# Patient Record
Sex: Male | Born: 1950
Health system: Southern US, Community
[De-identification: ages and names within clinical notes are randomized; demographics above are authoritative.]

## PROBLEM LIST (undated history)

## (undated) DIAGNOSIS — K635 Polyp of colon: Secondary | ICD-10-CM

## (undated) DIAGNOSIS — K219 Gastro-esophageal reflux disease without esophagitis: Secondary | ICD-10-CM

## (undated) DIAGNOSIS — J189 Pneumonia, unspecified organism: Secondary | ICD-10-CM

## (undated) DIAGNOSIS — K5792 Diverticulitis of intestine, part unspecified, without perforation or abscess without bleeding: Secondary | ICD-10-CM

## (undated) DIAGNOSIS — J939 Pneumothorax, unspecified: Secondary | ICD-10-CM

## (undated) DIAGNOSIS — M1990 Unspecified osteoarthritis, unspecified site: Secondary | ICD-10-CM

## (undated) DIAGNOSIS — L409 Psoriasis, unspecified: Secondary | ICD-10-CM

---

## 1991-08-11 HISTORY — PX: OTHER SURGICAL HISTORY: SHX169

## 1991-08-11 HISTORY — PX: HERNIA REPAIR: SHX51

## 2003-10-06 ENCOUNTER — Emergency Department (HOSPITAL_COMMUNITY): Admission: EM | Admit: 2003-10-06 | Discharge: 2003-10-07 | Payer: Self-pay | Admitting: *Deleted

## 2004-03-25 ENCOUNTER — Other Ambulatory Visit: Admission: RE | Admit: 2004-03-25 | Discharge: 2004-03-25 | Payer: Self-pay | Admitting: Dermatology

## 2005-05-14 ENCOUNTER — Emergency Department (HOSPITAL_COMMUNITY): Admission: EM | Admit: 2005-05-14 | Discharge: 2005-05-15 | Payer: Self-pay | Admitting: Emergency Medicine

## 2006-03-05 ENCOUNTER — Encounter (INDEPENDENT_AMBULATORY_CARE_PROVIDER_SITE_OTHER): Payer: Self-pay | Admitting: Specialist

## 2006-03-05 ENCOUNTER — Ambulatory Visit: Payer: Self-pay | Admitting: Internal Medicine

## 2006-03-05 ENCOUNTER — Ambulatory Visit (HOSPITAL_COMMUNITY): Admission: RE | Admit: 2006-03-05 | Discharge: 2006-03-05 | Payer: Self-pay | Admitting: Internal Medicine

## 2007-08-15 ENCOUNTER — Inpatient Hospital Stay (HOSPITAL_COMMUNITY): Admission: EM | Admit: 2007-08-15 | Discharge: 2007-08-19 | Payer: Self-pay | Admitting: Emergency Medicine

## 2010-04-21 ENCOUNTER — Emergency Department (HOSPITAL_COMMUNITY): Admission: EM | Admit: 2010-04-21 | Discharge: 2010-04-22 | Payer: Self-pay | Admitting: Emergency Medicine

## 2010-12-23 NOTE — Discharge Summary (Signed)
NAME:  Jeffrey Mcmahon, Jeffrey Mcmahon                  ACCOUNT NO.:  [id number]   MEDICAL RECORD NO.:  [id number]          PATIENT TYPE:  INP   LOCATION:  A308                          FACILITY:  [facility]   PHYSICIAN:  Tilford Pillar, MD      DATE OF BIRTH:  1950/12/05   DATE OF ADMISSION:  08/15/2007  DATE OF DISCHARGE:  01/09/2009LH                               DISCHARGE SUMMARY   ADMISSION DIAGNOSIS:  Right spontaneous pneumothorax.   DISCHARGE DIAGNOSES:  1. Status post right spontaneous pneumothorax with chest tube      placement.  2. Asthma, chronic obstructive pulmonary disease.   PROCEDURES:  Right sided Pleurocath placement.   SURGICAL CONSULTATION:  Tilford Pillar, M.D.   DISPOSITION:  Home.   BRIEF HISTORY AND PHYSICAL:  Please see the admission history and  physical for a complete H&P.  The patient is a 60 year old gentleman who  presented to [facility] with acute onset of shortness of breath  and chest pain on the right side.  During his evaluation in the  emergency department he was discovered to have a spontaneous  pneumothorax.  The patient was admitted for continued management and  monitoring of the pneumothorax.   HOSPITAL COURSE:  The patient was admitted on August 15, 2007.  He  underwent the placement of a new catheter in the emergency department  before being transferred to the floor for continued monitoring.  On the  subsequent hospital days he continued to be monitored with serial chest  x-rays which demonstrated persistence of the pneumothorax, increasing of  the negative pressure on the catheter did result in resolution.  On the  morning of August 19, 2007, the patient had his Pleurocath set to water  seal.  A followup x-ray demonstrated complete resolution of the  pneumothorax.  Therefore, the catheter was removed and an occlusive  dressing was placed at the time of removal.  The patient was discharged  to home and was in no acute distress.   DISCHARGE  INSTRUCTIONS:  The patient is to resume normal activities.  He  may return to work as tolerated.  He was instructed to keep the  occlusive dressing on for a 24-hour period, then he may remove it in the  shower.  He was instructed to resume all previously prescribed home  medications as well as to follow up with Dr. Ouida Sills as  previously arranged.  No immediate followup with myself or Dr. Ouida Sills is  necessary at this time.  The patient was instructed that should he  develop shortness of breath that he should return to the emergency  department.   DISCHARGE MEDICATIONS:  Albuterol.      Tilford Pillar, MD  Electronically Signed     BZ/MEDQ  D:  08/19/2007  T:  08/19/2007  Job:  [id number]   cc:   Kingsley Callander. Ouida Sills, MD  Fax: [phone]

## 2010-12-23 NOTE — H&P (Signed)
NAME:  Jeffrey Mcmahon, BARRETTO                  ACCOUNT NO.:  [id number]   MEDICAL RECORD NO.:  [id number]          PATIENT TYPE:  INP   LOCATION:  A302                          FACILITY:  [facility]   PHYSICIAN:  Kingsley Callander. Ouida Sills, MD       DATE OF BIRTH:  07-May-1951   DATE OF ADMISSION:  08/15/2007  DATE OF DISCHARGE:  LH                              HISTORY & PHYSICAL   CHIEF COMPLAINT:  Right chest pain.   HISTORY OF PRESENT ILLNESS:  This patient is a 60 year old white male  who presented to the emergency room after developing pain in the right  side of his chest pain.  The pain was increased with taking a deep  breath or coughing.  He had not experienced any fever or sputum  production.  He was found to have a 20% pneumothorax on the right.  He  has a past history of asthma.   PAST MEDICAL HISTORY:  1. Asthma.  2. Allergies.  3. Eosinophilia  4. Psoriasis.  5. Tubular adenomas.  6. History of septic right hip.  7. Status post left inguinal hernia repair.   MEDICATIONS:  1. Claritin 10 mg daily.  2. Albuterol p.r.n.  3. Advair 100 1 puff b.i.d.   ALLERGIES:  NONE.   FAMILY HISTORY:  His father had COPD, TB, and diabetes.  His mother had  COPD.   SOCIAL HISTORY:  He is a Optician, dispensing.  He does not smoke or drink.   REVIEW OF SYSTEMS:  Noncontributory.   PHYSICAL EXAMINATION:  VITAL SIGNS:  Temperature 98.3, pulse 81, blood  pressure 121/78, respirations 18, oxygen saturation 99% on room air.  GENERAL:  Alert and in no acute distress.  HEENT:  Eyes, nose, and pharynx normal.  NECK:  No JVD or thyromegaly.  LUNGS:  Clear.  HEART:  Regular with no murmurs.  ABDOMEN:  Nontender.  No hepatosplenomegaly.  EXTREMITIES:  No calf tenderness or swelling.  No cyanosis, clubbing, or  edema.  NEUROLOGIC:  Intact.  LYMPH NODES:  No cervical or supraclavicular enlargement.  SKIN:  Several psoriasis-type lesions are present.   LABORATORY DATA:  White count 4.6, hemoglobin 14.8.  Sodium 139,  potassium 3.6, glucose 98, creatinine 0.87.  SGOT 24, calcium 9.2.   Chest x-ray reveals a 20% right pneumothorax.   IMPRESSION:  1. Spontaneous right pneumothorax.  Dr. Leticia Penna from general surgery      has been consulted and has placed a chest tube on the right.  Mr.      Mcmahon had a brief episode of syncope when the chest tube was placed.      He developed a bradycardia and actually a code was briefly called.      Placed in Trendelenburg, and his heart rhythm returned  to normal      and he became alert and fully awake.  2. Asthma/allergies.  Stable.  3. Psoriasis.      Kingsley Callander. Ouida Sills, MD  Electronically Signed     ROF/MEDQ  D:  08/16/2007  T:  08/16/2007  Job:  [id number]

## 2010-12-23 NOTE — Consult Note (Signed)
NAME:  Jeffrey Mcmahon, Jeffrey Mcmahon                  ACCOUNT NO.:  [id number]   MEDICAL RECORD NO.:  [id number]          PATIENT TYPE:  INP   LOCATION:  A302                          FACILITY:  [facility]   PHYSICIAN:  Tilford Pillar, MD      DATE OF BIRTH:  04-Sep-1950   DATE OF CONSULTATION:  DATE OF DISCHARGE:                                 CONSULTATION   REASON FOR CONSULTATION:  Spontaneous pneumothorax.   HISTORY OF PRESENT ILLNESS:  The patient is a 60 year old male who  presented to the [facility] emergency department after acute onset of  right chest pain.  He described this as the acute onset of sharp pain  with inspiration this morning.  He continued his usual morning  activities with no resolution of the pain.  This has been constant,  worse with inspirations.  He has had no nausea and vomiting, no fever or  chills, no lightheadedness.  He has had no prior symptoms similar to  this in the past.   PAST MEDICAL HISTORY:  Positive for COPD.   PAST SURGICAL HISTORY:  No thoracic surgery noted in the past.   MEDICATIONS:  Briefly are reviewed with the patient.  He is on an  albuterol inhaler taken on an as needed basis.  He requires this  approximately one time per day.   PHYSICAL EXAMINATION:  The patient is currently sitting in an upright  position on an emergency room cart in no acute distress.  He is alert  and oriented.  Pupils are equal, round, and reactive.  Extraocular  movements are intact.  Oral mucosa is pink and moist.  Trachea is  midline.  There is no JVD noted.  There is no cervical lymphadenopathy  appreciated.  No subcutaneous emphysema is apparent on palpation.  He  has slightly diminished breath sounds on the right compared to the left.  No crackles or wheezes are appreciated.  Heart rate is regular rate and  rhythm.  Extremities are warm and dry.   PERTINENT LABORATORY AND RADIOGRAPHIC STUDIES:  An upright chest x-ray  demonstrates an approximate 20% pneumothorax on the  patient's right  side.  No other abnormalities are noted.   ASSESSMENT AND PLAN:  Spontaneous pneumothorax on the right side.  This  is the patient's first episode of a spontaenous pneumothorax.  He is  advised to have a tube decompression of this as this is a small  pneumothorax.  This was recommended to first place a pneumo cath.  The  risks, benefits, and alternatives of placement of a pneumo cath  including the placement of a standard thoracotomy tube were discussed at  length with the patient.  At this point,  plans will be made for placement of the pneumo cath using sterile  technique.  Upon placement of the catheter, he will be admitted by Dr.  Ouida Sills, and I will continue to follow the patient for continued  management of this pneumothorax.      Tilford Pillar, MD  Electronically Signed     BZ/MEDQ  D:  08/15/2007  T:  08/16/2007  Job:  [id number]   cc:   Kingsley Callander. Ouida Sills, MD  Fax: [phone]

## 2010-12-23 NOTE — Discharge Summary (Signed)
NAME:  Jeffrey Mcmahon, Jeffrey Mcmahon                  ACCOUNT NO.:  [id number]   MEDICAL RECORD NO.:  [id number]          PATIENT TYPE:  INP   LOCATION:  A308                          FACILITY:  [facility]   PHYSICIAN:  Kingsley Callander. Ouida Sills, MD       DATE OF BIRTH:  09/27/1950   DATE OF ADMISSION:  08/15/2007  DATE OF DISCHARGE:  01/09/2009LH                               DISCHARGE SUMMARY   DISCHARGE DIAGNOSES:  1. Spontaneous right pneumothorax.  2. Asthma.   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  He was evaluated in the emergency  room and found to have a 20% pneumothorax.  Dr. Leticia Penna was consulted.  A chest tube was placed.  There was gradual improvement.  The chest tube  was placed to water seal on August 19, 2007.  He had complete resolution  of the pneumothorax at that point.  The chest tube was then pulled  without complication.  The patient is now asymptomatic.   He has a history of asthma which has been stable.  He will continue  albuterol a p.r.n. basis   DISCHARGE MEDICATIONS:  1. Albuterol two puffs q.4h. p.r.n.  2. Claritin 10 mg daily.  3. Advair 100 one puff b.i.d.   FOLLOWUP:  The patient will be followed up in the office as scheduled.      Kingsley Callander. Ouida Sills, MD  Electronically Signed     ROF/MEDQ  D:  08/19/2007  T:  08/20/2007  Job:  [id number]

## 2010-12-23 NOTE — Op Note (Signed)
NAME:  Jeffrey Mcmahon, Jeffrey Mcmahon                  ACCOUNT NO.:  [id number]   MEDICAL RECORD NO.:  [id number]          PATIENT TYPE:  INP   LOCATION:  A302                          FACILITY:  [facility]   PHYSICIAN:  Tilford Pillar, MD      DATE OF BIRTH:  1951/05/19   DATE OF PROCEDURE:  DATE OF DISCHARGE:                               OPERATIVE REPORT   PREOPERATIVE DIAGNOSES:  Right sided spontaneous pneumothorax.   POSTOPERATIVE DIAGNOSES:  Right sided spontaneous pneumothorax.   PROCEDURE:  Placement of right sided Pneumocath catheter.   ANESTHESIA:  Local.   ESTIMATED BLOOD LOSS:  Minimal.   COMPLICATIONS:  Vasovagal response with spontaneous resolution.   INDICATIONS FOR PROCEDURE:  The patient is a 60 year old male who  presented to the [facility] emergency department with acute onset of  right sided chest pain.  During evaluation, it was noted that he had a  right sided spontaneous pneumothorax.  As this was his first  pneumothorax on this side, it was recommended that he have a Pneumocath  catheter placed.  The risks, benefits and alternatives including  traditional tube thoracostomy were discussed with the patient.  This was  planned.  We will plan to proceed with Pneumocath placement and consent  was obtained.   PROCEDURE IN DETAIL:  The patient was kept in a seated position.  His  right arm was abducted and extended over his head.  His right chest was  prepped with Chlorhexidine solution.  Sterile drapes were placed.  The  1% Lidocaine was injected at the anterior axillary line and  approximately the fourth intercostal space.  The local anesthetic was  injected to the pleura.  Penetration of the pleura was noted with good  aspiration of air and some additional flushing of Lidocaine into the  pleural space.  At this point a stab incision was created in the  anterior axillary line.  The Pneumocath was advanced in the standard  fashion with aspiration on the syringe until good air  aspiration was  obtained.  At this point the catheter was advanced over the insertion  needle, the needle was removed, and the catheter was attached to the  standard tubing and left to atmospheric venting.  At this point, the  silk suture was utilized to secure the Pneumocath to the skin.  The  patient at this point tolerated the procedure well.  A sterile dressing  was then placed.  During the placement of the dressing, the patient  began to complain of not feeling well and feeling lightheaded, at which  time, the patient had a bradycardic episode with an asystolic period  that lasted less than five seconds.  The radial pulse was immediately  obtained with return of pulse rate by putting the patient into a reverse  Trendelenburg head down position.  At this point the patient regained  consciousness and was alert and oriented to all questions.  At this  point the catheter was secured to the pleurovac.  Good air return was  initially noted with no persistent bubbling of the water seal canister.  At this point he continued to be alert and oriented with a heart rate  that had returned to the 60s.  The blood pressure was with systolic of  119.  Thus the patient was returned to a slightly seated position with  no additional episodes of lightheadedness or fainting sensation.  At  this point a chest x-  ray was obtained for confirmation of pleural placement and confirmation  of the resolution of the pneumothorax.  Sharps were disposed of in the  appropriate manner.  Other than the vasovagal episode, the patient  tolerated the procedure very well and continued to do well following the  episode.      Tilford Pillar, MD  Electronically Signed     BZ/MEDQ  D:  08/15/2007  T:  08/16/2007  Job:  [id number]

## 2010-12-26 NOTE — Op Note (Signed)
NAME:  Jeffrey Mcmahon, Jeffrey Mcmahon                  ACCOUNT NO.:  [id number]   MEDICAL RECORD NO.:  [id number]          PATIENT TYPE:  AMB   LOCATION:  DAY                           FACILITY:  [facility]   PHYSICIAN:  Lionel December, M.D.    DATE OF BIRTH:  05/13/1951   DATE OF PROCEDURE:  03/05/2006  DATE OF DISCHARGE:                                 OPERATIVE REPORT   PROCEDURE:  Colonoscopy with polypectomy.   INDICATION:  [name] is 60 year old Caucasian male who is here first for  average risk screening colonoscopy.  Procedure risks were reviewed the  patient, informed consent was obtained.   MEDS FOR CONSCIOUS SEDATION:  Demerol 50 mg IV Versed 6 mg IV.   FINDINGS:  Procedure performed in endoscopy suite.  The patient's vital  signs and O2 sat were monitored during procedure and remained stable.  The  patient was placed left lateral position.  Rectal examination performed.  No  abnormality noted external or digital exam.  Olympus videoscope was placed  rectum and advanced under vision into sigmoid colon and beyond.  Moderate  number of diverticula noted in sigmoid colon with a few more scattered in  the rest of the colon all the way to the cecum.  Preparation was  satisfactory.  Scope was passed to the cecum which was identified by  appendiceal orifice and ileocecal valve.  Pictures taken for the record.  There two tiny polyps at ascending colon which ablated via cold biopsy and  submitted one container.  Mucosa rest of the colon was normal.  The rectum  there was a 7-8 mm sessile polyp which was snared and retrieved for  histologic examination.  Rest of the rectal mucosa was normal.  Scope was  retroflexed to examine anorectal junction and small hemorrhoids were noted  below the dentate line.  Endoscope was straightened and withdrawn.  The  patient tolerated the procedure well.   FINAL DIAGNOSIS:  Pan colonic diverticulosis.  He has moderate number of  diverticula at sigmoid colon.  Two tiny polyps  ablated via cold biopsy from  the ascending colon.  8 mL sessile polyp snared from the rectum.   RECOMMENDATIONS:  Standard instructions given.  High-fiber diet.  Fiber  supplement 3-4 grams daily.   I will be contacting the patient with biopsy results and further  recommendations.      Lionel December, M.D.  Electronically Signed     NR/MEDQ  D:  03/05/2006  T:  03/05/2006  Job:  [id number]   cc:   Kingsley Callander. Ouida Sills, MD  Fax: [phone]

## 2011-03-18 ENCOUNTER — Encounter (INDEPENDENT_AMBULATORY_CARE_PROVIDER_SITE_OTHER): Payer: Self-pay | Admitting: *Deleted

## 2011-04-10 ENCOUNTER — Telehealth (INDEPENDENT_AMBULATORY_CARE_PROVIDER_SITE_OTHER): Payer: Self-pay | Admitting: *Deleted

## 2011-04-10 DIAGNOSIS — Z86010 Personal history of colonic polyps: Secondary | ICD-10-CM

## 2011-04-10 NOTE — Telephone Encounter (Signed)
TCS sch'd 07/29/11 @ 7:30 (6:30), movi prep instructions mailed

## 2011-04-14 MED ORDER — PEG-KCL-NACL-NASULF-NA ASC-C 100 G PO SOLR: 1.0000 | Freq: Once | ORAL | Status: DC

## 2011-04-29 LAB — DIFFERENTIAL
Basophils Absolute: 0.1
Basophils Relative: 1
Eosinophils Absolute: 0.6
Eosinophils Relative: 12 — ABNORMAL HIGH
Lymphocytes Relative: 23
Lymphs Abs: 1.1
Monocytes Absolute: 0.3
Monocytes Relative: 6
Neutro Abs: 2.7
Neutrophils Relative %: 57

## 2011-04-29 LAB — CBC
HCT: 41.7
Hemoglobin: 14.8
MCHC: 35.6
MCV: 87.2
Platelets: 213
RBC: 4.78
RDW: 12.6
WBC: 4.6

## 2011-04-29 LAB — COMPREHENSIVE METABOLIC PANEL
ALT: 30
AST: 24
Albumin: 3.7
Alkaline Phosphatase: 40
BUN: 8
CO2: 26
Calcium: 9.2
Chloride: 106
Creatinine, Ser: 0.87
GFR calc Af Amer: >60
GFR calc non Af Amer: >60
Glucose, Bld: 98
Potassium: 3.6
Sodium: 139
Total Bilirubin: 0.6
Total Protein: 6.2

## 2011-04-29 LAB — D-DIMER, QUANTITATIVE: D-Dimer, Quant: 0.22

## 2011-06-16 ENCOUNTER — Other Ambulatory Visit (INDEPENDENT_AMBULATORY_CARE_PROVIDER_SITE_OTHER): Payer: Self-pay | Admitting: *Deleted

## 2011-06-16 DIAGNOSIS — Z86010 Personal history of colonic polyps: Secondary | ICD-10-CM

## 2011-06-23 ENCOUNTER — Encounter (INDEPENDENT_AMBULATORY_CARE_PROVIDER_SITE_OTHER): Payer: Self-pay | Admitting: *Deleted

## 2011-07-09 ENCOUNTER — Telehealth (INDEPENDENT_AMBULATORY_CARE_PROVIDER_SITE_OTHER): Payer: Self-pay | Admitting: *Deleted

## 2011-07-09 NOTE — Telephone Encounter (Signed)
PCP/Requesting MD:  Ouida Sills  Name: Jeffrey Mcmahon  DOB: June 25, 2051  Home Phone: [phone]      Procedure: TCS  Reason/Indication:  SURVEILLANCE, HX POLYPS  Has patient had this procedure before?  YES  If so, when, by whom and where?  2007  Is there a family history of colon cancer?  NO  Who?  What age when diagnosed?    Is patient diabetic?   NO      Does patient have prosthetic heart valve?  NO  Do you have a pacemaker?  NO  Has patient had joint replacement within last 12 months?  NO  Is patient on Coumadin, Plavix and/or Aspirin? NO  Medications: NAPROOXEN 500 MG BID, VENTOLIN INHALER PRN, ADVAIR DAILY, ONE A DAY VIT, FISH OIL, CO Q 10 DAILY  Allergies: NKDA  Pharmacy: [facility]  Medication Adjustment: NONE  Procedure date & time: 07/29/11 @ 7:30

## 2011-07-13 NOTE — Telephone Encounter (Signed)
agree

## 2011-07-27 ENCOUNTER — Encounter (HOSPITAL_COMMUNITY): Payer: Self-pay | Admitting: Pharmacy Technician

## 2011-07-27 ENCOUNTER — Telehealth (INDEPENDENT_AMBULATORY_CARE_PROVIDER_SITE_OTHER): Payer: Self-pay | Admitting: *Deleted

## 2011-07-27 NOTE — Telephone Encounter (Signed)
Spoke to patient, answered his questions

## 2011-07-27 NOTE — Telephone Encounter (Signed)
Has a procedure scheduled this week and has some questions. Please return his call to [phone].

## 2011-07-28 MED ORDER — SODIUM CHLORIDE 0.45 % IV SOLN
Freq: Once | INTRAVENOUS | Status: AC
  Administered 2011-07-29: 12:00:00 via INTRAVENOUS

## 2011-07-29 ENCOUNTER — Encounter (HOSPITAL_COMMUNITY)
Admission: RE | Disposition: A | Discharge status: Home / Self Care | Payer: Self-pay | Source: Ambulatory Visit | Attending: Internal Medicine

## 2011-07-29 ENCOUNTER — Encounter (HOSPITAL_COMMUNITY): Payer: Self-pay | Admitting: *Deleted

## 2011-07-29 ENCOUNTER — Ambulatory Visit (HOSPITAL_COMMUNITY)
Admission: RE | Admit: 2011-07-29 | Discharge: 2011-07-29 | Disposition: A | Discharge status: Home / Self Care | Payer: Self-pay | Source: Ambulatory Visit | Attending: Internal Medicine | Admitting: Internal Medicine

## 2011-07-29 DIAGNOSIS — K5730 Diverticulosis of large intestine without perforation or abscess without bleeding: Secondary | ICD-10-CM

## 2011-07-29 DIAGNOSIS — Z860100 Personal history of colon polyps, unspecified: Secondary | ICD-10-CM | POA: Insufficient documentation

## 2011-07-29 DIAGNOSIS — K644 Residual hemorrhoidal skin tags: Secondary | ICD-10-CM | POA: Insufficient documentation

## 2011-07-29 DIAGNOSIS — Z09 Encounter for follow-up examination after completed treatment for conditions other than malignant neoplasm: Secondary | ICD-10-CM

## 2011-07-29 HISTORY — DX: Polyp of colon: K63.5

## 2011-07-29 HISTORY — DX: Pneumonia, unspecified organism: J18.9

## 2011-07-29 HISTORY — PX: COLONOSCOPY: SHX5424

## 2011-07-29 SURGERY — COLONOSCOPY
Anesthesia: Moderate Sedation

## 2011-07-29 MED ORDER — MEPERIDINE HCL 50 MG/ML IJ SOLN
INTRAMUSCULAR | Status: AC
  Filled 2011-07-29: qty 1

## 2011-07-29 MED ORDER — STERILE WATER FOR IRRIGATION IR SOLN
Status: DC | PRN
  Administered 2011-07-29: 13:00:00

## 2011-07-29 MED ORDER — MIDAZOLAM HCL 5 MG/5ML IJ SOLN
INTRAMUSCULAR | Status: AC
  Filled 2011-07-29: qty 10

## 2011-07-29 MED ORDER — MEPERIDINE HCL 50 MG/ML IJ SOLN
INTRAMUSCULAR | Status: DC | PRN
  Administered 2011-07-29 (×2): 25 mg via INTRAVENOUS

## 2011-07-29 MED ORDER — MIDAZOLAM HCL 5 MG/5ML IJ SOLN
INTRAMUSCULAR | Status: DC | PRN
  Administered 2011-07-29 (×2): 2 mg via INTRAVENOUS
  Administered 2011-07-29: 1 mg via INTRAVENOUS

## 2011-07-29 NOTE — Op Note (Signed)
COLONOSCOPY PROCEDURE REPORT  PATIENT:  Jeffrey Mcmahon  MR#:  [id number] Birthdate:  Feb 01, 1951, 60 y.o., male Endoscopist:  Dr. Malissa Hippo, MD Referred By:  Dr. Carylon Perches, MD Procedure Date: 07/29/2011  Procedure:   Colonoscopy  Indications:  Patient is 60 year old Caucasian male with history of colonic adenomas. Last exam was in July 2007 with removal of two tubular adenomas. He says he was recently treated for colitis with antibiotic and 6 his prednisone and he is presently free of GI symptoms.  Informed Consent: Procedure and risks were reviewed with the patient and informed consent was obtained.  Medications:  Demerol 50 mg IV Versed 5 mg IV  Description of procedure:  After a digital rectal exam was performed, that colonoscope was advanced from the anus through the rectum and colon to the area of the cecum, ileocecal valve and appendiceal orifice. The cecum was deeply intubated. These structures were well-seen and photographed for the record. From the level of the cecum and ileocecal valve, the scope was slowly and cautiously withdrawn. The mucosal surfaces were carefully surveyed utilizing scope tip to flexion to facilitate fold flattening as needed. The scope was pulled down into the rectum where a thorough exam including retroflexion was performed.  Findings:   Prep satisfactory. Moderate number of diverticula at sigmoid colon and few more scattered throughout the colon all the way to  Cecum. Small hemorrhoids below the dentate line.  Therapeutic/Diagnostic Maneuvers Performed:  None  Complications:  None  Cecal Withdrawal Time:  10 minutes  Impression:  Examination performed to cecum. Pan colonic diverticulosis. Small external hemorrhoids.  Recommendations:  Standard instructions given. Next exam in 5 years.  Ahlam Piscitelli U  07/29/2011 1:28 PM  CC: Dr. Carylon Perches, MD & Dr. Bonnetta Barry ref. provider found

## 2011-07-29 NOTE — H&P (Signed)
Jeffrey Mcmahon is an 60 y.o. male.   Chief Complaint: Patient is here for colonoscopy. HPI: Patient is 60 year old Caucasian male with history of colonic adenomas. His last exam was in July 2007 2 tubular adenomas removed. He said for some in his colonoscopy. He denies abdominal pain or change in his bowel habits. He also denies melena or rectal bleeding. He was recently treated treated for pneumonia and is not having shortness of breath. Family history is negative for colorectal carcinoma  Past Medical History  Diagnosis Date  . Pneumonia     December 2012  . Asthma   . Colon polyps   . Constipation     Past Surgical History  Procedure Date  . Right hip surgery 1993    Secondary to a staph infection  . Hernia repair 1993    Family History  Problem Relation Age of Onset  . Colon cancer Neg Hx    Social History:  reports that he has quit smoking. He does not have any smokeless tobacco history on file. He reports that he does not drink alcohol or use illicit drugs.  Allergies: No Known Allergies  Medications Prior to Admission  Medication Dose Route Frequency Provider Last Rate Last Dose  . 0.45 % sodium chloride infusion   Intravenous Once Malissa Hippo, MD 20 mL/hr at 07/29/11 1143    . meperidine (DEMEROL) 50 MG/ML injection           . midazolam (VERSED) 5 MG/5ML injection            Medications Prior to Admission  Medication Sig Dispense Refill  . peg 3350 powder (MOVIPREP) 100 G SOLR Take 1 kit (100 g total) by mouth once.  1 kit  0    No results found for this or any previous visit (from the past 48 hour(s)). No results found.  Review of Systems  Constitutional: Negative for weight loss.  Gastrointestinal: Negative for abdominal pain, diarrhea, constipation, blood in stool and melena.    Blood pressure 126/76, pulse 68, temperature 97.9 F (36.6 C), temperature source Oral, resp. rate 20, height 5\' 11"  (1.803 m), weight 191 lb (86.637 kg), SpO2 96.00%. Physical  Exam  Constitutional: He is oriented to person, place, and time. He appears well-developed and well-nourished.  HENT:  Mouth/Throat: Oropharynx is clear and moist.  Eyes: Conjunctivae are normal. No scleral icterus.  Neck: No thyromegaly present.  Cardiovascular: Normal rate, regular rhythm and normal heart sounds.   No murmur heard. Respiratory: Effort normal. Respiratory distress: few rhonchi right lung anteriorly.  GI: Soft. He exhibits no distension and no mass. There is no tenderness.  Musculoskeletal: He exhibits no edema.  Lymphadenopathy:    He has no cervical adenopathy.  Neurological: He is alert and oriented to person, place, and time.  Skin: Skin is warm and dry.     Assessment/Plan History of colonic polyps. Surveillance colonoscopy  Jeffrey Mcmahon U 07/29/2011, 1:02 PM

## 2011-08-13 ENCOUNTER — Encounter (HOSPITAL_COMMUNITY): Payer: Self-pay | Admitting: Internal Medicine

## 2013-02-23 ENCOUNTER — Encounter (HOSPITAL_COMMUNITY): Payer: Self-pay

## 2013-02-23 ENCOUNTER — Emergency Department (HOSPITAL_COMMUNITY)
Admission: EM | Admit: 2013-02-23 | Discharge: 2013-02-24 | Disposition: A | Discharge status: Home / Self Care | Payer: PRIVATE HEALTH INSURANCE | Attending: Emergency Medicine | Admitting: Emergency Medicine

## 2013-02-23 DIAGNOSIS — R05 Cough: Secondary | ICD-10-CM | POA: Insufficient documentation

## 2013-02-23 DIAGNOSIS — R059 Cough, unspecified: Secondary | ICD-10-CM | POA: Insufficient documentation

## 2013-02-23 DIAGNOSIS — Z8701 Personal history of pneumonia (recurrent): Secondary | ICD-10-CM | POA: Insufficient documentation

## 2013-02-23 DIAGNOSIS — Z860100 Personal history of colon polyps, unspecified: Secondary | ICD-10-CM | POA: Insufficient documentation

## 2013-02-23 DIAGNOSIS — Z79899 Other long term (current) drug therapy: Secondary | ICD-10-CM | POA: Insufficient documentation

## 2013-02-23 DIAGNOSIS — J45901 Unspecified asthma with (acute) exacerbation: Secondary | ICD-10-CM | POA: Insufficient documentation

## 2013-02-23 DIAGNOSIS — Z791 Long term (current) use of non-steroidal anti-inflammatories (NSAID): Secondary | ICD-10-CM | POA: Insufficient documentation

## 2013-02-23 DIAGNOSIS — J4541 Moderate persistent asthma with (acute) exacerbation: Secondary | ICD-10-CM

## 2013-02-23 DIAGNOSIS — Z87891 Personal history of nicotine dependence: Secondary | ICD-10-CM | POA: Insufficient documentation

## 2013-02-23 DIAGNOSIS — Z8719 Personal history of other diseases of the digestive system: Secondary | ICD-10-CM | POA: Insufficient documentation

## 2013-02-23 NOTE — ED Notes (Signed)
Started having shortness of breath around 10 pm, woke up coughing, then started having trouble breathing. Used inhaler about 10 times since this started, also did a nebulizer treatment.

## 2013-02-24 ENCOUNTER — Emergency Department (HOSPITAL_COMMUNITY): Payer: PRIVATE HEALTH INSURANCE

## 2013-02-24 LAB — POCT I-STAT, CHEM 8
Chloride: 105 mEq/L (ref 96–112)
HCT: 37 % — ABNORMAL LOW (ref 39.0–52.0)
Potassium: 3.3 mEq/L — ABNORMAL LOW (ref 3.5–5.1)
Sodium: 143 mEq/L (ref 135–145)

## 2013-02-24 LAB — CBC
HCT: 38.4 % — ABNORMAL LOW (ref 39.0–52.0)
MCV: 83.8 fL (ref 78.0–100.0)
RDW: 13 % (ref 11.5–15.5)
WBC: 4.4 10*3/uL (ref 4.0–10.5)

## 2013-02-24 MED ORDER — METHYLPREDNISOLONE SODIUM SUCC 125 MG IJ SOLR
125.0000 mg | Freq: Once | INTRAMUSCULAR | Status: AC
  Administered 2013-02-24: 125 mg via INTRAVENOUS
  Filled 2013-02-24: qty 2

## 2013-02-24 MED ORDER — PREDNISONE 20 MG PO TABS: 60.0000 mg | ORAL_TABLET | Freq: Every day | ORAL | Status: DC

## 2013-02-24 MED ORDER — POTASSIUM CHLORIDE CRYS ER 20 MEQ PO TBCR
20.0000 meq | EXTENDED_RELEASE_TABLET | Freq: Once | ORAL | Status: AC
  Administered 2013-02-24: 20 meq via ORAL
  Filled 2013-02-24: qty 1

## 2013-02-24 MED ORDER — ALBUTEROL SULFATE (5 MG/ML) 0.5% IN NEBU
5.0000 mg | INHALATION_SOLUTION | Freq: Once | RESPIRATORY_TRACT | Status: AC
  Administered 2013-02-24: 5 mg via RESPIRATORY_TRACT
  Filled 2013-02-24: qty 0.5

## 2013-02-24 MED ORDER — ALBUTEROL SULFATE (5 MG/ML) 0.5% IN NEBU
INHALATION_SOLUTION | RESPIRATORY_TRACT | Status: AC
  Filled 2013-02-24: qty 0.5

## 2013-02-24 MED ORDER — IPRATROPIUM BROMIDE 0.02 % IN SOLN
0.5000 mg | Freq: Once | RESPIRATORY_TRACT | Status: AC
  Administered 2013-02-24: 0.5 mg via RESPIRATORY_TRACT
  Filled 2013-02-24: qty 2.5

## 2013-02-24 NOTE — ED Provider Notes (Signed)
History    CSN: [id number] Arrival date & time 02/23/13  2346  First MD Initiated Contact with Patient 02/24/13 0011     Chief Complaint  Patient presents with  . Shortness of Breath   (Consider location/radiation/quality/duration/timing/severity/associated sxs/prior Treatment) HPI History provided by patient. Played golf today and went to sleep in his normal state of health. Woke up with wheezing and shortness of breath. He uses inhaler without relief and presents here the hospital. He has a history of "mild" asthma and has never required admission to the hospital or intubation for the same. He denies any recent fevers, cough or cold otherwise. No known sick contacts. No chest pain. No rash. No recent travel. No leg pain or leg swelling. Symptoms moderate to severe, worse with exertion.   Past Medical History  Diagnosis Date  . Pneumonia     December 2012  . Asthma   . Colon polyps   . Constipation    Past Surgical History  Procedure Laterality Date  . Right hip surgery  1993    Secondary to a staph infection  . Hernia repair  1993  . Colonoscopy  07/29/2011    Procedure: COLONOSCOPY;  Surgeon: Malissa Hippo, MD;  Location: [facility];  Service: Endoscopy;  Laterality: N/A;  7:30 am   Family History  Problem Relation Age of Onset  . Colon cancer Neg Hx    History  Substance Use Topics  . Smoking status: Former Games developer  . Smokeless tobacco: Not on file  . Alcohol Use: No    Review of Systems  Constitutional: Negative for fever and chills.  HENT: Negative for neck pain and neck stiffness.   Eyes: Negative for pain.  Respiratory: Positive for cough, shortness of breath and wheezing.   Cardiovascular: Negative for chest pain.  Gastrointestinal: Negative for abdominal pain.  Genitourinary: Negative for dysuria.  Musculoskeletal: Negative for back pain.  Skin: Negative for rash.  Neurological: Negative for headaches.  All other systems reviewed and are  negative.    Allergies  Review of patient's allergies indicates no known allergies.  Home Medications   Current Outpatient Rx  Name  Route  Sig  Dispense  Refill  . albuterol (PROVENTIL HFA;VENTOLIN HFA) 108 (90 BASE) MCG/ACT inhaler   Inhalation   Inhale 2 puffs into the lungs every 6 (six) hours as needed. For shortness of breath          . HYDROcodone-acetaminophen (NORCO/VICODIN) 5-325 MG per tablet   Oral   Take 1 tablet by mouth every 6 (six) hours as needed for pain.         Marland Kitchen Lysine HCl 1000 MG TABS   Oral   Take 1 tablet by mouth daily.           . Multiple Vitamins-Minerals (MULTIVITAMINS THER. W/MINERALS) TABS   Oral   Take 1 tablet by mouth daily.           Murray Hodgkins 500 MG CAPS   Oral   Take 1 capsule by mouth daily.           . naproxen (NAPROSYN) 500 MG tablet   Oral   Take 500 mg by mouth 2 (two) times daily with a meal.           . predniSONE (DELTASONE) 20 MG tablet   Oral   Take 60 mg by mouth daily. Started on 07-24-11 for 6 days          .  vitamin C (ASCORBIC ACID) 500 MG tablet   Oral   Take 500 mg by mouth daily.            BP 139/82  Pulse 104  Temp(Src) 98 F (36.7 C) (Oral)  Resp 24  Ht 5' 11.5" (1.816 m)  Wt 193 lb (87.544 kg)  BMI 26.55 kg/m2  SpO2 96% Physical Exam  Constitutional: He is oriented to person, place, and time. He appears well-developed and well-nourished.  HENT:  Head: Normocephalic and atraumatic.  Mouth/Throat: Oropharynx is clear and moist.  Eyes: EOM are normal. Pupils are equal, round, and reactive to light.  Neck: Neck supple.  Cardiovascular: Regular rhythm and intact distal pulses.   Mild tachycardia heart rate 100-110  Pulmonary/Chest: He exhibits no tenderness.  Mild tachypnea with bilateral expiratory and inspiratory wheezes.  Abdominal: Soft. There is no tenderness.  Musculoskeletal: Normal range of motion. He exhibits no edema and no tenderness.  Neurological: He is alert and  oriented to person, place, and time.  Skin: Skin is warm and dry.    ED Course  Procedures (including critical care time)  Results for orders placed during the hospital encounter of 02/23/13  CBC      Result Value Range   WBC 4.4  4.0 - 10.5 K/uL   RBC 4.58  4.22 - 5.81 MIL/uL   Hemoglobin 14.3  13.0 - 17.0 g/dL   HCT 16.1 (*) 09.6 - 04.5 %   MCV 83.8  78.0 - 100.0 fL   MCH 31.2  26.0 - 34.0 pg   MCHC 37.2 (*) 30.0 - 36.0 g/dL   RDW 40.9  81.1 - 91.4 %   Platelets 177  150 - 400 K/uL  POCT I-STAT, CHEM 8      Result Value Range   Sodium 143  135 - 145 mEq/L   Potassium 3.3 (*) 3.5 - 5.1 mEq/L   Chloride 105  96 - 112 mEq/L   BUN 17  6 - 23 mg/dL   Creatinine, Ser 7.82  0.50 - 1.35 mg/dL   Glucose, Bld 956 (*) 70 - 99 mg/dL   Calcium, Ion 2.13  0.86 - 1.30 mmol/L   TCO2 26  0 - 100 mmol/L   Hemoglobin 12.6 (*) 13.0 - 17.0 g/dL   HCT 57.8 (*) 46.9 - 62.9 %   Dg Chest Portable 1 View  02/24/2013   *RADIOLOGY REPORT*  Clinical Data: Shortness of breath and cough; history of asthma and smoking.  PORTABLE CHEST - 1 VIEW  Comparison: Chest radiograph performed 04/21/2010  Findings: The lungs are well-aerated.  Mild vascular congestion is noted, with mildly increased interstitial markings, raising question for minimal interstitial edema.  Peribronchial thickening is seen.  There is mild elevation of the right hemidiaphragm. There is no evidence of pleural effusion or pneumothorax.  The cardiomediastinal silhouette is within normal limits.  No acute osseous abnormalities are seen.  IMPRESSION: Mild vascular congestion, with mildly increased interstitial markings, raising question for minimal interstitial edema. Peribronchial thickening seen.   Original Report Authenticated By: Tonia Ghent, M.D.    IV steroids. Albuterol nebulized treatment.  2:34 AM on recheck feels significantly better and is requesting to be discharged home. Potassium provided for mild hypokalemia. Repeat lung exam -  clear in all fields without respiratory distress or tachypnea, moving good air. He has an inhaler at home. I will prescribe five-day course of prednisone and recommend recheck with primary care physician. Chest x-ray reviewed as above.  MDM  Asthma exacerbation  Improved with steroids and single breathing treatment  Chest x-ray labs reviewed as above. Potassium provided.  Vital signs and nursing notes reviewed and considered  Sunnie Nielsen, MD 02/24/13 [phone]

## 2013-02-24 NOTE — ED Notes (Signed)
Pt alert & oriented x4, stable gait. Patient given discharge instructions, paperwork & prescription(s). Patient  instructed to stop at the registration desk to finish any additional paperwork. Patient verbalized understanding. Pt left department w/ no further questions. 

## 2013-02-24 NOTE — ED Notes (Signed)
Pt states he feels much better and is back to his baseline breathing

## 2013-03-03 ENCOUNTER — Encounter (HOSPITAL_COMMUNITY): Payer: Self-pay

## 2013-03-03 ENCOUNTER — Encounter (HOSPITAL_COMMUNITY)
Admission: RE | Admit: 2013-03-03 | Discharge: 2013-03-03 | Disposition: A | Discharge status: Home / Self Care | Payer: PRIVATE HEALTH INSURANCE | Source: Ambulatory Visit | Attending: Orthopedic Surgery | Admitting: Orthopedic Surgery

## 2013-03-03 ENCOUNTER — Encounter (HOSPITAL_COMMUNITY): Payer: Self-pay | Admitting: Pharmacy Technician

## 2013-03-03 DIAGNOSIS — Z01812 Encounter for preprocedural laboratory examination: Secondary | ICD-10-CM | POA: Insufficient documentation

## 2013-03-03 HISTORY — DX: Unspecified osteoarthritis, unspecified site: M19.90

## 2013-03-03 HISTORY — DX: Psoriasis, unspecified: L40.9

## 2013-03-03 HISTORY — DX: Gastro-esophageal reflux disease without esophagitis: K21.9

## 2013-03-03 LAB — CBC
MCH: 30.5 pg (ref 26.0–34.0)
Platelets: 212 10*3/uL (ref 150–400)
RBC: 4.91 MIL/uL (ref 4.22–5.81)
RDW: 13 % (ref 11.5–15.5)
WBC: 6.9 10*3/uL (ref 4.0–10.5)

## 2013-03-03 LAB — BASIC METABOLIC PANEL
Calcium: 9.5 mg/dL (ref 8.4–10.5)
GFR calc non Af Amer: >90 mL/min (ref >90)
Sodium: 138 mEq/L (ref 135–145)

## 2013-03-03 LAB — URINALYSIS, ROUTINE W REFLEX MICROSCOPIC
Hgb urine dipstick: NEGATIVE
Ketones, ur: NEGATIVE mg/dL
Protein, ur: NEGATIVE mg/dL
Urobilinogen, UA: 1 mg/dL (ref 0.0–1.0)

## 2013-03-03 LAB — SURGICAL PCR SCREEN: MRSA, PCR: POSITIVE — AB

## 2013-03-03 LAB — APTT: aPTT: 29 seconds (ref 24–37)

## 2013-03-03 LAB — PROTIME-INR: Prothrombin Time: 13.3 seconds (ref 11.6–15.2)

## 2013-03-03 NOTE — Progress Notes (Signed)
Surgery clearance note 01/06/13 Dr. Ouida Sills on chart, office note 01/06/13 on chart, EKG 01/06/13 on chart, Chest x-ray 02/24/13 on EPIC

## 2013-03-03 NOTE — Patient Instructions (Addendum)
[address] Jeffrey Mcmahon BABA  03/03/2013   Your procedure is scheduled on: 03/14/13  Report to [facility] at 10:45 AM.  Call this number if you have problems the morning of surgery [phone]: [phone]   Remember: please bring inhaler on day of surgery    Do not eat food After Midnight, clear liquids from midnight until 7:45 am on 03/14/13 then nothing.      Take these medicines the morning of surgery with A SIP OF WATER:  Inhaler if needed, tramadol if needed   Do not wear jewelry, make-up or nail polish.  Do not wear lotions, powders, or perfumes. You may wear deodorant.  Do not shave 48 hours prior to surgery. Men may shave face and neck.  Do not bring valuables to the hospital.  Leave suitcase in the car. After surgery it may be brought to your room.  For patients admitted to the hospital, checkout time is 11:00 AM the day of discharge.    Please read over the following fact sheets that you were given: MRSA Information, incentive spirometry fact sheet, clear liquids fact sheet, blood fact sheet  Birdie Sons, RN  pre op nurse call if needed [phone]    FAILURE TO FOLLOW THESE INSTRUCTIONS MAY RESULT IN CANCELLATION OF YOUR SURGERY   Patient Signature: ___________________________________________

## 2013-03-08 ENCOUNTER — Inpatient Hospital Stay (HOSPITAL_COMMUNITY): Admission: RE | Admit: 2013-03-08 | Payer: Self-pay | Source: Ambulatory Visit

## 2013-03-08 NOTE — H&P (Signed)
TOTAL HIP ADMISSION H&P  Patient is admitted for right total hip arthroplasty, anterior approach.  Subjective:  Chief Complaint   Right hip OA / pain  HPI: Jeffrey Mcmahon, 62 y.o. male, has a history of pain and functional disability in the right hip(s) due to trauma and patient has failed non-surgical conservative treatments for greater than 12 weeks to include NSAID's and/or analgesics and activity modification.  Onset of symptoms was gradual starting 3 years ago with gradually worsening course since that time.The patient noted prior procedures of the hip to include opening the joint to scrap out infection on the right hip(s).  Patient currently rates pain in the right hip at 10 out of 10 with activity. Patient has worsening of pain with activity and weight bearing, trendelenberg gait, pain that interfers with activities of daily living, pain with passive range of motion and when he first wakes in the morning. Patient has evidence of periarticular osteophytes and joint space narrowing by imaging studies. This condition presents safety issues increasing the risk of falls.  There is no current active infection.  Risks, benefits and expectations were discussed with the patient. Patient understand the risks, benefits and expectations and wishes to proceed with surgery.   D/C Plans:   Home with HHPT  Post-op Meds:     Rx given for ASA, Robaxin, Iron, Colace and MiraLax  Tranexamic Acid:   To be given  Decadron:    To be given  FYI:    ASA post-op  Tramadol scheduled. Norco prn.   Past Medical History  Diagnosis Date  . Pneumonia     December 2012  . Asthma   . Colon polyps   . Constipation   . GERD (gastroesophageal reflux disease)     occasional  . Arthritis   . Psoriasis     Past Surgical History  Procedure Laterality Date  . Right hip surgery  1993    Secondary to a staph infection  . Hernia repair  1993  . Colonoscopy  07/29/2011    Procedure: COLONOSCOPY;  Surgeon: Malissa Hippo, MD;  Location: [facility];  Service: Endoscopy;  Laterality: N/A;  7:30 am    No Known Allergies   History  Substance Use Topics  . Smoking status: Never Smoker   . Smokeless tobacco: Never Used  . Alcohol Use: No    Family History  Problem Relation Age of Onset  . Colon cancer Neg Hx      Review of Systems  Constitutional: Negative.   HENT: Negative.   Eyes: Negative.   Respiratory: Negative.   Cardiovascular: Negative.   Gastrointestinal: Positive for heartburn and constipation.  Genitourinary: Negative.   Musculoskeletal: Positive for joint pain.  Skin: Positive for rash.  Neurological: Negative.   Endo/Heme/Allergies: Negative.   Psychiatric/Behavioral: Negative.     Objective:  Physical Exam  Constitutional: He is oriented to person, place, and time. He appears well-developed and well-nourished.  HENT:  Head: Normocephalic and atraumatic.  Mouth/Throat: Oropharynx is clear and moist.  Eyes: Pupils are equal, round, and reactive to light.  Neck: Neck supple. No JVD present. No tracheal deviation present. No thyromegaly present.  Cardiovascular: Normal rate, regular rhythm, normal heart sounds and intact distal pulses.   Respiratory: Effort normal and breath sounds normal. No stridor.  GI: Soft. There is no tenderness. There is no guarding.  Musculoskeletal:       Right hip: He exhibits decreased range of motion, decreased strength, tenderness and bony  tenderness. He exhibits no swelling, no deformity and no laceration.  Lymphadenopathy:    He has no cervical adenopathy.  Neurological: He is alert and oriented to person, place, and time.  Skin: Skin is warm and dry.  Psychiatric: He has a normal mood and affect.    Labs:  Estimated body mass index is 26.65 kg/(m^2) as calculated from the following:   Height as of 07/29/11: 5\' 11"  (1.803 m).   Weight as of 07/29/11: 86.637 kg (191 lb).   Imaging Review Plain radiographs demonstrate severe  degenerative joint disease of the right hip(s). The bone quality appears to be good for age and reported activity level.  Assessment/Plan:  End stage arthritis, right hip(s)  The patient history, physical examination, clinical judgement of the provider and imaging studies are consistent with end stage degenerative joint disease of the right hip(s) and total hip arthroplasty is deemed medically necessary. The treatment options including medical management, injection therapy, arthroscopy and arthroplasty were discussed at length. The risks and benefits of total hip arthroplasty were presented and reviewed. The risks due to aseptic loosening, infection, stiffness, dislocation/subluxation,  thromboembolic complications and other imponderables were discussed.  The patient acknowledged the explanation, agreed to proceed with the plan and consent was signed. Patient is being admitted for inpatient treatment for surgery, pain control, PT, OT, prophylactic antibiotics, VTE prophylaxis, progressive ambulation and ADL's and discharge planning.The patient is planning to be discharged home with home health services.    Anastasio Auerbach Miara Emminger   PAC  03/08/2013, 3:02 PM

## 2013-03-14 ENCOUNTER — Encounter (HOSPITAL_COMMUNITY): Payer: Self-pay | Admitting: Anesthesiology

## 2013-03-14 ENCOUNTER — Inpatient Hospital Stay (HOSPITAL_COMMUNITY): Payer: PRIVATE HEALTH INSURANCE

## 2013-03-14 ENCOUNTER — Encounter (HOSPITAL_COMMUNITY): Payer: Self-pay | Admitting: *Deleted

## 2013-03-14 ENCOUNTER — Encounter (HOSPITAL_COMMUNITY)
Admission: RE | Disposition: A | Discharge status: Home Health | Payer: Self-pay | Source: Ambulatory Visit | Attending: Orthopedic Surgery

## 2013-03-14 ENCOUNTER — Inpatient Hospital Stay (HOSPITAL_COMMUNITY)
Admission: RE | Admit: 2013-03-14 | Discharge: 2013-03-15 | DRG: 470 | Disposition: A | Discharge status: Home Health | Payer: Self-pay | Source: Ambulatory Visit | Attending: Orthopedic Surgery | Admitting: Orthopedic Surgery

## 2013-03-14 ENCOUNTER — Inpatient Hospital Stay (HOSPITAL_COMMUNITY): Payer: PRIVATE HEALTH INSURANCE | Admitting: Anesthesiology

## 2013-03-14 DIAGNOSIS — Z6825 Body mass index (BMI) 25.0-25.9, adult: Secondary | ICD-10-CM

## 2013-03-14 DIAGNOSIS — E663 Overweight: Secondary | ICD-10-CM | POA: Diagnosis present

## 2013-03-14 DIAGNOSIS — K5900 Constipation, unspecified: Secondary | ICD-10-CM | POA: Diagnosis present

## 2013-03-14 DIAGNOSIS — M169 Osteoarthritis of hip, unspecified: Principal | ICD-10-CM | POA: Diagnosis present

## 2013-03-14 DIAGNOSIS — Z96649 Presence of unspecified artificial hip joint: Secondary | ICD-10-CM

## 2013-03-14 DIAGNOSIS — R21 Rash and other nonspecific skin eruption: Secondary | ICD-10-CM | POA: Diagnosis present

## 2013-03-14 DIAGNOSIS — Z01812 Encounter for preprocedural laboratory examination: Secondary | ICD-10-CM

## 2013-03-14 DIAGNOSIS — M1610 Unilateral primary osteoarthritis, unspecified hip: Principal | ICD-10-CM | POA: Diagnosis present

## 2013-03-14 HISTORY — PX: TOTAL HIP ARTHROPLASTY: SHX124

## 2013-03-14 LAB — TYPE AND SCREEN: ABO/RH(D): O POS

## 2013-03-14 SURGERY — ARTHROPLASTY, HIP, TOTAL, ANTERIOR APPROACH
Anesthesia: Spinal | Site: Hip | Laterality: Right | Wound class: Clean

## 2013-03-14 MED ORDER — CEFAZOLIN SODIUM-DEXTROSE 2-3 GM-% IV SOLR
2.0000 g | INTRAVENOUS | Status: AC
  Administered 2013-03-14: 2 g via INTRAVENOUS

## 2013-03-14 MED ORDER — 0.9 % SODIUM CHLORIDE (POUR BTL) OPTIME
TOPICAL | Status: DC | PRN
  Administered 2013-03-14: 1000 mL

## 2013-03-14 MED ORDER — METHOCARBAMOL 500 MG PO TABS
500.0000 mg | ORAL_TABLET | Freq: Four times a day (QID) | ORAL | Status: DC | PRN
  Filled 2013-03-14: qty 1

## 2013-03-14 MED ORDER — DEXAMETHASONE SODIUM PHOSPHATE 10 MG/ML IJ SOLN
10.0000 mg | Freq: Once | INTRAMUSCULAR | Status: AC
  Administered 2013-03-15: 10 mg via INTRAVENOUS
  Filled 2013-03-14: qty 1

## 2013-03-14 MED ORDER — PROMETHAZINE HCL 25 MG/ML IJ SOLN: 6.2500 mg | INTRAMUSCULAR | Status: DC | PRN

## 2013-03-14 MED ORDER — PHENOL 1.4 % MT LIQD: 1.0000 | OROMUCOSAL | Status: DC | PRN

## 2013-03-14 MED ORDER — DEXAMETHASONE SODIUM PHOSPHATE 10 MG/ML IJ SOLN
10.0000 mg | Freq: Once | INTRAMUSCULAR | Status: AC
  Administered 2013-03-14: 10 mg via INTRAVENOUS

## 2013-03-14 MED ORDER — PROPOFOL INFUSION 10 MG/ML OPTIME
INTRAVENOUS | Status: DC | PRN
  Administered 2013-03-14: 50 ug/kg/min via INTRAVENOUS

## 2013-03-14 MED ORDER — MOMETASONE FURO-FORMOTEROL FUM 100-5 MCG/ACT IN AERO
2.0000 | INHALATION_SPRAY | Freq: Two times a day (BID) | RESPIRATORY_TRACT | Status: DC
  Administered 2013-03-14 – 2013-03-15 (×2): 2 via RESPIRATORY_TRACT
  Filled 2013-03-14: qty 8.8

## 2013-03-14 MED ORDER — SODIUM CHLORIDE 0.9 % IV SOLN
100.0000 mL/h | INTRAVENOUS | Status: DC
  Administered 2013-03-14 – 2013-03-15 (×2): 100 mL/h via INTRAVENOUS
  Filled 2013-03-14 (×5): qty 1000

## 2013-03-14 MED ORDER — CELECOXIB 200 MG PO CAPS
200.0000 mg | ORAL_CAPSULE | Freq: Two times a day (BID) | ORAL | Status: DC
  Administered 2013-03-14 – 2013-03-15 (×2): 200 mg via ORAL
  Filled 2013-03-14 (×3): qty 1

## 2013-03-14 MED ORDER — ALBUTEROL SULFATE HFA 108 (90 BASE) MCG/ACT IN AERS
2.0000 | INHALATION_SPRAY | Freq: Four times a day (QID) | RESPIRATORY_TRACT | Status: DC | PRN
  Filled 2013-03-14: qty 6.7

## 2013-03-14 MED ORDER — ASPIRIN EC 325 MG PO TBEC
325.0000 mg | DELAYED_RELEASE_TABLET | Freq: Two times a day (BID) | ORAL | Status: DC
  Administered 2013-03-15: 325 mg via ORAL
  Filled 2013-03-14 (×3): qty 1

## 2013-03-14 MED ORDER — PHENYLEPHRINE HCL 10 MG/ML IJ SOLN
INTRAMUSCULAR | Status: DC | PRN
  Administered 2013-03-14: 80 ug via INTRAVENOUS

## 2013-03-14 MED ORDER — STERILE WATER FOR IRRIGATION IR SOLN
Status: DC | PRN
  Administered 2013-03-14: 3000 mL

## 2013-03-14 MED ORDER — METOCLOPRAMIDE HCL 5 MG/ML IJ SOLN: 5.0000 mg | Freq: Three times a day (TID) | INTRAMUSCULAR | Status: DC | PRN

## 2013-03-14 MED ORDER — DOCUSATE SODIUM 100 MG PO CAPS
100.0000 mg | ORAL_CAPSULE | Freq: Two times a day (BID) | ORAL | Status: DC
  Administered 2013-03-14 – 2013-03-15 (×2): 100 mg via ORAL

## 2013-03-14 MED ORDER — DIPHENHYDRAMINE HCL 25 MG PO CAPS: 25.0000 mg | ORAL_CAPSULE | Freq: Four times a day (QID) | ORAL | Status: DC | PRN

## 2013-03-14 MED ORDER — BUPIVACAINE LIPOSOME 1.3 % IJ SUSP
20.0000 mL | Freq: Once | INTRAMUSCULAR | Status: DC
  Filled 2013-03-14: qty 20

## 2013-03-14 MED ORDER — MENTHOL 3 MG MT LOZG: 1.0000 | LOZENGE | OROMUCOSAL | Status: DC | PRN

## 2013-03-14 MED ORDER — ONDANSETRON HCL 4 MG/2ML IJ SOLN
4.0000 mg | Freq: Four times a day (QID) | INTRAMUSCULAR | Status: DC | PRN
  Administered 2013-03-15: 4 mg via INTRAVENOUS
  Filled 2013-03-14: qty 2

## 2013-03-14 MED ORDER — LACTATED RINGERS IV SOLN: INTRAVENOUS | Status: DC

## 2013-03-14 MED ORDER — ACETAMINOPHEN 500 MG PO TABS
1000.0000 mg | ORAL_TABLET | Freq: Once | ORAL | Status: AC
  Administered 2013-03-14: 1000 mg via ORAL
  Filled 2013-03-14: qty 2

## 2013-03-14 MED ORDER — HYDROCODONE-ACETAMINOPHEN 7.5-325 MG PO TABS
1.0000 | ORAL_TABLET | ORAL | Status: DC | PRN
  Administered 2013-03-14 – 2013-03-15 (×3): 2 via ORAL
  Filled 2013-03-14 (×3): qty 2

## 2013-03-14 MED ORDER — FLEET ENEMA 7-19 GM/118ML RE ENEM: 1.0000 | ENEMA | Freq: Once | RECTAL | Status: AC | PRN

## 2013-03-14 MED ORDER — MEPERIDINE HCL 50 MG/ML IJ SOLN: 6.2500 mg | INTRAMUSCULAR | Status: DC | PRN

## 2013-03-14 MED ORDER — CEFAZOLIN SODIUM-DEXTROSE 2-3 GM-% IV SOLR
2.0000 g | Freq: Four times a day (QID) | INTRAVENOUS | Status: AC
  Administered 2013-03-14 (×2): 2 g via INTRAVENOUS
  Filled 2013-03-14 (×2): qty 50

## 2013-03-14 MED ORDER — BISACODYL 10 MG RE SUPP: 10.0000 mg | Freq: Every day | RECTAL | Status: DC | PRN

## 2013-03-14 MED ORDER — ZOLPIDEM TARTRATE 5 MG PO TABS
5.0000 mg | ORAL_TABLET | Freq: Every evening | ORAL | Status: DC | PRN
  Administered 2013-03-15: 5 mg via ORAL
  Filled 2013-03-14: qty 1

## 2013-03-14 MED ORDER — BUPIVACAINE HCL (PF) 0.5 % IJ SOLN
INTRAMUSCULAR | Status: DC | PRN
  Administered 2013-03-14: 15 mg

## 2013-03-14 MED ORDER — EPHEDRINE SULFATE 50 MG/ML IJ SOLN
INTRAMUSCULAR | Status: DC | PRN
  Administered 2013-03-14 (×4): 10 mg via INTRAVENOUS

## 2013-03-14 MED ORDER — CELECOXIB 200 MG PO CAPS
200.0000 mg | ORAL_CAPSULE | Freq: Once | ORAL | Status: AC
  Administered 2013-03-14: 200 mg via ORAL
  Filled 2013-03-14: qty 1

## 2013-03-14 MED ORDER — METHOCARBAMOL 100 MG/ML IJ SOLN
500.0000 mg | Freq: Four times a day (QID) | INTRAVENOUS | Status: DC | PRN
  Filled 2013-03-14 (×2): qty 5

## 2013-03-14 MED ORDER — FERROUS SULFATE 325 (65 FE) MG PO TABS
325.0000 mg | ORAL_TABLET | Freq: Three times a day (TID) | ORAL | Status: DC
  Administered 2013-03-15 (×2): 325 mg via ORAL
  Filled 2013-03-14 (×5): qty 1

## 2013-03-14 MED ORDER — LACTATED RINGERS IV SOLN
INTRAVENOUS | Status: DC | PRN
  Administered 2013-03-14 (×3): via INTRAVENOUS

## 2013-03-14 MED ORDER — HYDROMORPHONE HCL PF 1 MG/ML IJ SOLN
0.5000 mg | INTRAMUSCULAR | Status: DC | PRN
  Administered 2013-03-14: 1 mg via INTRAVENOUS
  Filled 2013-03-14: qty 1

## 2013-03-14 MED ORDER — POLYETHYLENE GLYCOL 3350 17 G PO PACK
17.0000 g | PACK | Freq: Two times a day (BID) | ORAL | Status: DC
  Administered 2013-03-14 – 2013-03-15 (×2): 17 g via ORAL

## 2013-03-14 MED ORDER — MIDAZOLAM HCL 5 MG/5ML IJ SOLN
INTRAMUSCULAR | Status: DC | PRN
  Administered 2013-03-14: 2 mg via INTRAVENOUS

## 2013-03-14 MED ORDER — ALUM & MAG HYDROXIDE-SIMETH 200-200-20 MG/5ML PO SUSP
30.0000 mL | ORAL | Status: DC | PRN
  Administered 2013-03-15: 30 mL via ORAL
  Filled 2013-03-14: qty 30

## 2013-03-14 MED ORDER — FENTANYL CITRATE 0.05 MG/ML IJ SOLN
INTRAMUSCULAR | Status: DC | PRN
  Administered 2013-03-14: 100 ug via INTRAVENOUS

## 2013-03-14 MED ORDER — ONDANSETRON HCL 4 MG PO TABS
4.0000 mg | ORAL_TABLET | Freq: Four times a day (QID) | ORAL | Status: DC | PRN
  Administered 2013-03-14: 4 mg via ORAL
  Filled 2013-03-14: qty 1

## 2013-03-14 MED ORDER — HYDROMORPHONE HCL PF 1 MG/ML IJ SOLN: 0.2500 mg | INTRAMUSCULAR | Status: DC | PRN

## 2013-03-14 MED ORDER — TRAMADOL HCL 50 MG PO TABS
50.0000 mg | ORAL_TABLET | Freq: Four times a day (QID) | ORAL | Status: DC
  Administered 2013-03-14 (×2): 100 mg via ORAL
  Filled 2013-03-14 (×2): qty 2

## 2013-03-14 MED ORDER — TRANEXAMIC ACID 100 MG/ML IV SOLN
1000.0000 mg | Freq: Once | INTRAVENOUS | Status: AC
  Administered 2013-03-14: 1000 mg via INTRAVENOUS
  Filled 2013-03-14: qty 10

## 2013-03-14 MED ORDER — LACTATED RINGERS IV SOLN
INTRAVENOUS | Status: DC
  Administered 2013-03-14: 1000 mL via INTRAVENOUS

## 2013-03-14 MED ORDER — METOCLOPRAMIDE HCL 10 MG PO TABS: 5.0000 mg | ORAL_TABLET | Freq: Three times a day (TID) | ORAL | Status: DC | PRN

## 2013-03-14 SURGICAL SUPPLY — 41 items
ADH SKN CLS APL DERMABOND .7 (GAUZE/BANDAGES/DRESSINGS) ×1
BAG SPEC THK2 15X12 ZIP CLS (MISCELLANEOUS) ×2
BAG ZIPLOCK 12X15 (MISCELLANEOUS) ×4 IMPLANT
BLADE SAW SGTL 18X1.27X75 (BLADE) ×2 IMPLANT
CAPT HIP PF COP ×1 IMPLANT
CLOTH BEACON ORANGE TIMEOUT ST (SAFETY) ×2 IMPLANT
DERMABOND ADVANCED (GAUZE/BANDAGES/DRESSINGS) ×1
DERMABOND ADVANCED .7 DNX12 (GAUZE/BANDAGES/DRESSINGS) ×1 IMPLANT
DRAPE C-ARM 42X120 X-RAY (DRAPES) ×2 IMPLANT
DRAPE STERI IOBAN 125X83 (DRAPES) ×2 IMPLANT
DRAPE U-SHAPE 47X51 STRL (DRAPES) ×6 IMPLANT
DRSG AQUACEL AG ADV 3.5X10 (GAUZE/BANDAGES/DRESSINGS) ×2 IMPLANT
DRSG MEPILEX BORDER 4X8 (GAUZE/BANDAGES/DRESSINGS) ×1 IMPLANT
DRSG TEGADERM 4X4.75 (GAUZE/BANDAGES/DRESSINGS) ×1 IMPLANT
DURAPREP 26ML APPLICATOR (WOUND CARE) ×2 IMPLANT
ELECT BLADE TIP CTD 4 INCH (ELECTRODE) ×2 IMPLANT
ELECT REM PT RETURN 9FT ADLT (ELECTROSURGICAL) ×2
ELECTRODE REM PT RTRN 9FT ADLT (ELECTROSURGICAL) ×1 IMPLANT
EVACUATOR 1/8 PVC DRAIN (DRAIN) IMPLANT
FACESHIELD LNG OPTICON STERILE (SAFETY) ×8 IMPLANT
GAUZE SPONGE 2X2 8PLY STRL LF (GAUZE/BANDAGES/DRESSINGS) ×1 IMPLANT
GLOVE BIOGEL PI IND STRL 7.5 (GLOVE) ×1 IMPLANT
GLOVE BIOGEL PI IND STRL 8 (GLOVE) ×1 IMPLANT
GLOVE BIOGEL PI INDICATOR 7.5 (GLOVE) ×1
GLOVE BIOGEL PI INDICATOR 8 (GLOVE) ×1
GLOVE ECLIPSE 8.0 STRL XLNG CF (GLOVE) ×2 IMPLANT
GLOVE ORTHO TXT STRL SZ7.5 (GLOVE) ×4 IMPLANT
GOWN BRE IMP PREV XXLGXLNG (GOWN DISPOSABLE) ×2 IMPLANT
GOWN STRL NON-REIN LRG LVL3 (GOWN DISPOSABLE) ×2 IMPLANT
KIT BASIN OR (CUSTOM PROCEDURE TRAY) ×2 IMPLANT
PACK TOTAL JOINT (CUSTOM PROCEDURE TRAY) ×2 IMPLANT
PADDING CAST COTTON 6X4 STRL (CAST SUPPLIES) ×2 IMPLANT
SPONGE GAUZE 2X2 STER 10/PKG (GAUZE/BANDAGES/DRESSINGS) ×1
SUCTION FRAZIER 12FR DISP (SUCTIONS) ×2 IMPLANT
SUT MNCRL AB 4-0 PS2 18 (SUTURE) ×2 IMPLANT
SUT VIC AB 1 CT1 36 (SUTURE) ×8 IMPLANT
SUT VIC AB 2-0 CT1 27 (SUTURE) ×4
SUT VIC AB 2-0 CT1 TAPERPNT 27 (SUTURE) ×2 IMPLANT
SUT VLOC 180 0 24IN GS25 (SUTURE) ×2 IMPLANT
TOWEL OR 17X26 10 PK STRL BLUE (TOWEL DISPOSABLE) ×4 IMPLANT
TRAY FOLEY CATH 14FRSI W/METER (CATHETERS) ×2 IMPLANT

## 2013-03-14 NOTE — Interval H&P Note (Signed)
History and Physical Interval Note:  03/14/2013 11:35 AM  Jeffrey Mcmahon  has presented today for surgery, with the diagnosis of OSTEOARTHRITIS RIGHT HIP  The various methods of treatment have been discussed with the patient and family. After consideration of risks, benefits and other options for treatment, the patient has consented to  Procedure(s): RIGHT TOTAL HIP ARTHROPLASTY ANTERIOR APPROACH (Right) as a surgical intervention .  The patient's history has been reviewed, patient examined, no change in status, stable for surgery.  I have reviewed the patient's chart and labs.  Questions were answered to the patient's satisfaction.     Shelda Pal

## 2013-03-14 NOTE — Anesthesia Procedure Notes (Signed)
Spinal  Patient location during procedure: OR Start time: 03/14/2013 12:34 PM Staffing Anesthesiologist: Gaylan Gerold CRNA/Resident: [place], Myleen Brailsford C Performed by: anesthesiologist and resident/CRNA  Preanesthetic Checklist Completed: patient identified, site marked, surgical consent, pre-op evaluation, timeout performed, IV checked, risks and benefits discussed and monitors and equipment checked Spinal Block Patient position: sitting Prep: Betadine Patient monitoring: heart rate, cardiac monitor, continuous pulse ox and blood pressure Approach: midline Location: L2-3 Injection technique: single-shot Needle Needle type: Sprotte  Needle gauge: 24 G Assessment Sensory level: T6

## 2013-03-14 NOTE — Transfer of Care (Signed)
Immediate Anesthesia Transfer of Care Note  Patient: Jeffrey Mcmahon  Procedure(s) Performed: Procedure(s): RIGHT TOTAL HIP ARTHROPLASTY ANTERIOR APPROACH (Right)  Patient Location: PACU  Anesthesia Type:MAC  Level of Consciousness: awake and alert   Airway & Oxygen Therapy: Patient Spontanous Breathing and Patient connected to face mask oxygen  Post-op Assessment: Report given to PACU RN and Post -op Vital signs reviewed and stable  Post vital signs: Reviewed and stable  Complications: No apparent anesthesia complications

## 2013-03-14 NOTE — Anesthesia Postprocedure Evaluation (Signed)
Anesthesia Post Note  Patient: Jeffrey Mcmahon  Procedure(s) Performed: Procedure(s) (LRB): RIGHT TOTAL HIP ARTHROPLASTY ANTERIOR APPROACH (Right)  Anesthesia type: Spinal  Patient location: PACU  Post pain: Pain level controlled  Post assessment: Post-op Vital signs reviewed  Last Vitals: BP 144/81  Pulse 104  Temp(Src) 37 C (Oral)  Resp 16  Ht 5\' 11"  (1.803 m)  Wt 191 lb (86.637 kg)  BMI 26.65 kg/m2  SpO2 96%  Post vital signs: Reviewed  Level of consciousness: sedated  Complications: No apparent anesthesia complications

## 2013-03-14 NOTE — Op Note (Signed)
NAME:  Jeffrey Mcmahon NO.: [id number]      MEDICAL RECORD NO.: [id number]      FACILITY:  [facility]      PHYSICIAN:  Durene Romans D  DATE OF BIRTH:  1950-12-30     DATE OF PROCEDURE:  03/14/2013                                 OPERATIVE REPORT         PREOPERATIVE DIAGNOSIS: Right  hip osteoarthritis.      POSTOPERATIVE DIAGNOSIS:  Right hip osteoarthritis.      PROCEDURE:  Right total hip replacement through an anterior approach   utilizing DePuy THR system, component size 56mm pinnacle cup, a size 36+4 neutral   Altrex liner, a size 7 hi Tri Lock stem with a 36+1.5 delta ceramic   ball.      SURGEON:  Madlyn Frankel. Charlann Boxer, M.D.      ASSISTANT:  Leilani Able, PA-C      ANESTHESIA:  Spinal.      SPECIMENS:  None.      COMPLICATIONS:  None.      BLOOD LOSS:  500 cc     DRAINS:  One Hemovac.      INDICATION OF THE PROCEDURE:  Jeffrey Mcmahon is a 62 y.o. male who had   presented to office for evaluation of right hip pain.  Radiographs revealed   progressive degenerative changes with bone-on-bone   articulation to the  hip joint.  The patient had painful limited range of   motion significantly affecting their overall quality of life.  The patient was failing to    respond to conservative measures, and at this point was ready   to proceed with more definitive measures.  The patient has noted progressive   degenerative changes in his hip, progressive problems and dysfunction   with regarding the hip prior to surgery.  Consent was obtained for   benefit of pain relief.  Specific risk of infection, DVT, component   failure, dislocation, need for revision surgery, as well discussion of   the anterior versus posterior approach were reviewed.  Consent was   obtained for benefit of anterior pain relief through an anterior   approach.      PROCEDURE IN DETAIL:  The patient was brought to operative theater.   Once adequate anesthesia,  preoperative antibiotics, 2gm Ancef administered.   The patient was positioned supine on the OSI Hanna table.  Once adequate   padding of boney process was carried out, we had predraped out the hip, and  used fluoroscopy to confirm orientation of the pelvis and position.      The right hip was then prepped and draped from proximal iliac crest to   mid thigh with shower curtain technique.      Time-out was performed identifying the patient, planned procedure, and   extremity.     An incision was then made 2 cm distal and lateral to the   anterior superior iliac spine extending over the orientation of the   tensor fascia lata muscle and sharp dissection was carried down to the   fascia of the muscle and protractor placed in the soft tissues.      The fascia was then incised.  The muscle belly was identified and  swept   laterally and retractor placed along the superior neck.  Following   cauterization of the circumflex vessels and removing some pericapsular   fat, a second cobra retractor was placed on the inferior neck.  A third   retractor was placed on the anterior acetabulum after elevating the   anterior rectus.  A L-capsulotomy was along the line of the   superior neck to the trochanteric fossa, then extended proximally and   distally.  Tag sutures were placed and the retractors were then placed   intracapsular.  We then identified the trochanteric fossa and   orientation of my neck cut, confirmed this radiographically   and then made a neck osteotomy with the femur on traction.  The femoral   head was removed without difficulty or complication.  Traction was let   off and retractors were placed posterior and anterior around the   acetabulum.      The labrum and foveal tissue were debrided.  I began reaming with a 51mm   reamer and reamed up to 55mm reamer with good bony bed preparation and a 56   cup was chosen.  The final 56mm Pinnacle cup was then impacted under fluoroscopy  to  confirm the depth of penetration and orientation with respect to   abduction.  A screw was placed followed by the hole eliminator.  The final   36+4 neutral Altrex liner was impacted with good visualized rim fit.  The cup was positioned anatomically within the acetabular portion of the pelvis.      At this point, the femur was rolled at 80 degrees.  Further capsule was   released off the inferior aspect of the femoral neck.  I then   released the superior capsule proximally.  The hook was placed laterally   along the femur and elevated manually and held in position with the bed   hook.  The leg was then extended and adducted with the leg rolled to 100   degrees of external rotation.  Once the proximal femur was fully   exposed, I used a box osteotome to set orientation.  I then began   broaching with the starting chili pepper broach and passed this by hand and then broached up to 7.  With the 7 broach in place I chose a high offset neck and did a trial reduction with the 36+1.5 trial head ball.  The offset was appropriate, leg lengths   appeared to be equal, confirmed radiographically.   Given these findings, I went ahead and dislocated the hip, repositioned all   retractors and positioned the right hip in the extended and abducted position.  The final 7 hi Tri Lock stem was   chosen and it was impacted down to the level of neck cut.  Based on this   and the trial reduction, a 36+1.5 delta ceramic ball was chosen and   impacted onto a clean and dry trunnion, and the hip was reduced.  The   hip had been irrigated throughout the case again at this point.  I did   reapproximate the superior capsular leaflet to the anterior leaflet   using #1 Vicryl, placed a medium Hemovac drain deep.  The fascia of the   tensor fascia lata muscle was then reapproximated using #1 Vicryl.  The   remaining wound was closed with 2-0 Vicryl and running 4-0 Monocryl.   The hip was cleaned, dried, and dressed  sterilely using Dermabond and   Aquacel  dressing.  Drain site dressed separately.  She was then brought   to recovery room in stable condition tolerating the procedure well.    Leilani Able, PA-C was present for the entirety of the case involved from   preoperative positioning, perioperative retractor management, general   facilitation of the case, as well as primary wound closure as assistant.            Madlyn Frankel Charlann Boxer, M.D.            MDO/MEDQ  D:  06/02/2011  T:  06/02/2011  Job:  [id number]      Electronically Signed by Durene Romans M.D. on 06/08/2011 09:15:38 AM

## 2013-03-14 NOTE — Anesthesia Preprocedure Evaluation (Addendum)
Anesthesia Evaluation  Patient identified by MRN, date of birth, ID band Patient awake    Reviewed: Allergy & Precautions, H&P , NPO status , Patient's Chart, lab work & pertinent test results  Airway Mallampati: III TM Distance: >3 FB Neck ROM: Full    Dental  (+) Dental Advisory Given and Teeth Intact   Pulmonary asthma , pneumonia -, resolved,  breath sounds clear to auscultation        Cardiovascular negative cardio ROS  Rhythm:Regular Rate:Normal     Neuro/Psych negative neurological ROS  negative psych ROS   GI/Hepatic Neg liver ROS, GERD-  Medicated,  Endo/Other  negative endocrine ROS  Renal/GU negative Renal ROS     Musculoskeletal negative musculoskeletal ROS (+)   Abdominal   Peds  Hematology negative hematology ROS (+)   Anesthesia Other Findings   Reproductive/Obstetrics                          Anesthesia Physical Anesthesia Plan  ASA: II  Anesthesia Plan: Spinal   Post-op Pain Management:    Induction: Intravenous  Airway Management Planned:   Additional Equipment:   Intra-op Plan:   Post-operative Plan:   Informed Consent: I have reviewed the patients History and Physical, chart, labs and discussed the procedure including the risks, benefits and alternatives for the proposed anesthesia with the patient or authorized representative who has indicated his/her understanding and acceptance.   Dental advisory given  Plan Discussed with: CRNA  Anesthesia Plan Comments:        Anesthesia Quick Evaluation

## 2013-03-15 ENCOUNTER — Encounter (HOSPITAL_COMMUNITY): Payer: Self-pay | Admitting: Orthopedic Surgery

## 2013-03-15 DIAGNOSIS — E663 Overweight: Secondary | ICD-10-CM

## 2013-03-15 LAB — BASIC METABOLIC PANEL
CO2: 28 mEq/L (ref 19–32)
Chloride: 103 mEq/L (ref 96–112)
GFR calc non Af Amer: >90 mL/min (ref >90)
Glucose, Bld: 150 mg/dL — ABNORMAL HIGH (ref 70–99)
Potassium: 4 mEq/L (ref 3.5–5.1)
Sodium: 138 mEq/L (ref 135–145)

## 2013-03-15 LAB — CBC
HCT: 35.3 % — ABNORMAL LOW (ref 39.0–52.0)
Hemoglobin: 13.2 g/dL (ref 13.0–17.0)
MCHC: 37.4 g/dL — ABNORMAL HIGH (ref 30.0–36.0)
RBC: 4.29 MIL/uL (ref 4.22–5.81)
WBC: 10.5 10*3/uL (ref 4.0–10.5)

## 2013-03-15 MED ORDER — DSS 100 MG PO CAPS: 100.0000 mg | ORAL_CAPSULE | Freq: Two times a day (BID) | ORAL | Status: DC

## 2013-03-15 MED ORDER — FERROUS SULFATE 325 (65 FE) MG PO TABS: 325.0000 mg | ORAL_TABLET | Freq: Three times a day (TID) | ORAL | Status: DC

## 2013-03-15 MED ORDER — HYDROCODONE-ACETAMINOPHEN 7.5-325 MG PO TABS: 1.0000 | ORAL_TABLET | ORAL | Status: DC | PRN

## 2013-03-15 MED ORDER — ASPIRIN 325 MG PO TBEC: 325.0000 mg | DELAYED_RELEASE_TABLET | Freq: Two times a day (BID) | ORAL | Status: AC

## 2013-03-15 MED ORDER — POLYETHYLENE GLYCOL 3350 17 G PO PACK: 17.0000 g | PACK | Freq: Two times a day (BID) | ORAL | Status: DC

## 2013-03-15 MED ORDER — METHOCARBAMOL 500 MG PO TABS: 500.0000 mg | ORAL_TABLET | Freq: Four times a day (QID) | ORAL | Status: DC | PRN

## 2013-03-15 NOTE — Progress Notes (Signed)
Occupational Therapy Evaluation Patient Details Name: Jeffrey Mcmahon MRN: [id number] DOB: 02-01-1951 Today's Date: 03/15/2013 Time: 1000-1029 OT Time Calculation (min): 29 min  OT Assessment / Plan / Recommendation History of present illness s/p R THA - anterior   Clinical Impression   PTA pt independent with allADL and mobility. Educated pt on available AE and DME to assist with ADL. With AE, pt is Mod I with LB ADL, Pt making excllent progress. Demonstrated understanding use of AE and DME. All education completed. Pt ready to D/D home. Thanks    OT Assessment  Patient does not need any further OT services    Follow Up Recommendations  No OT follow up    Barriers to Discharge      Equipment Recommendations  3 in 1 bedside comode    Recommendations for Other Services    Frequency       Precautions / Restrictions Precautions Precautions: Fall Restrictions Weight Bearing Restrictions: No Other Position/Activity Restrictions: WBAT   Pertinent Vitals/Pain no apparent distress    ADL  Grooming: Modified independent Where Assessed - Grooming: Unsupported sitting Upper Body Bathing: Modified independent Where Assessed - Upper Body Bathing: Unsupported sit to stand Lower Body Bathing: Minimal assistance Where Assessed - Lower Body Bathing: Unsupported sit to stand Upper Body Dressing: Modified independent Where Assessed - Upper Body Dressing: Unsupported sitting Lower Body Dressing: Minimal assistance Where Assessed - Lower Body Dressing: Unsupported sit to stand Toilet Transfer: Supervision/safety Toilet Transfer Method: Other (comment) (ambulating) Acupuncturist: Materials engineer and Hygiene: Modified independent Where Assessed - Engineer, mining and Hygiene: Sit to stand from 3-in-1 or toilet Tub/Shower Transfer: Supervision/safety Tub/Shower Transfer Method: Ambulating Equipment Used: Gait belt;Rolling  walker;Sock aid;Reacher;Long-handled sponge Transfers/Ambulation Related to ADLs: S RW level ADL Comments: Educated pt on available AE to assist with LB ADL. Educated on use of DME for ADL    OT Diagnosis:    OT Problem List:   OT Treatment Interventions:     OT Goals(Current goals can be found in the care plan section) Acute Rehab OT Goals Patient Stated Goal: Resume previous lifestyle with decreased pain OT Goal Formulation: With patient  Visit Information  Last OT Received On: 03/15/13 Assistance Needed: +1 History of Present Illness: s/p R THA - anterior       Prior Functioning     Home Living Family/patient expects to be discharged to:: Private residence Living Arrangements: Spouse/significant other;Non-relatives/Friends Available Help at Discharge: Family Type of Home: House Home Access: Stairs to enter Secretary/administrator of Steps: 1 Entrance Stairs-Rails: None Home Layout: One level Home Equipment: Crutches Prior Function Level of Independence: Independent Communication Communication: No difficulties         Vision/Perception     Cognition  Cognition Arousal/Alertness: Awake/alert Behavior During Therapy: WFL for tasks assessed/performed Overall Cognitive Status: Within Functional Limits for tasks assessed    Extremity/Trunk Assessment Upper Extremity Assessment Upper Extremity Assessment: Overall WFL for tasks assessed Lower Extremity Assessment Lower Extremity Assessment: RLE deficits/detail RLE Deficits / Details: Hip strength 2/5 with AAROM at hip to 90 flex and 20 abd Cervical / Trunk Assessment Cervical / Trunk Assessment: Normal     Mobility   Transfers Transfers: Sit to Stand;Stand to Sit Sit to Stand: 5: Supervision;From chair/3-in-1 Stand to Sit: 5: Supervision;To chair/3-in-1 Details for Transfer Assistance: good hand placement     Exercise   Balance Balance Balance Assessed:  (WFL for ADL)   End of Session OT -  End of  Session Equipment Utilized During Treatment: Gait belt;Rolling walker Activity Tolerance: Patient tolerated treatment well Patient left: in chair;with call bell/phone within reach;with family/visitor present Nurse Communication: Mobility status  GO     Genice Kimberlin,HILLARY 03/15/2013, 11:22 AM [facility], OTR/L  [phone] 03/15/2013

## 2013-03-15 NOTE — Progress Notes (Signed)
Physical Therapy Treatment Patient Details Name: Jeffrey Mcmahon MRN: [id number] DOB: 04-05-1951 Today's Date: 03/15/2013 Time: 2956-2130 PT Time Calculation (min): 14 min  PT Assessment / Plan / Recommendation  History of Present Illness s/p R THA - anterior   PT Comments   Reviewed car transfers with pt and spouse  Follow Up Recommendations  Home health PT     Does the patient have the potential to tolerate intense rehabilitation     Barriers to Discharge        Equipment Recommendations  Rolling walker with 5" wheels    Recommendations for Other Services OT consult  Frequency 7X/week   Progress towards PT Goals Progress towards PT goals: Progressing toward goals  Plan Current plan remains appropriate    Precautions / Restrictions Precautions Precautions: Fall Restrictions Weight Bearing Restrictions: No Other Position/Activity Restrictions: WBAT   Pertinent Vitals/Pain 0/10 at rest; 8/10 with activity    Mobility  Transfers Transfers: Sit to Stand;Stand to Sit Sit to Stand: 5: Supervision;From chair/3-in-1 Stand to Sit: 5: Supervision;To chair/3-in-1 Details for Transfer Assistance: good hand placement Ambulation/Gait Ambulation/Gait Assistance: 5: Supervision Ambulation Distance (Feet): 200 Feet Assistive device: Rolling walker Ambulation/Gait Assistance Details: cues for posture and position from RW Gait Pattern: Step-to pattern;Step-through pattern;Antalgic Stairs: Yes Stairs Assistance: 4: Min assist Stairs Assistance Details (indicate cue type and reason): cues for RW/foot placment Stair Management Technique: No rails;Step to pattern;Forwards;With walker Number of Stairs: 1 (twice)    Exercises     PT Diagnosis:    PT Problem List:   PT Treatment Interventions:     PT Goals (current goals can now be found in the care plan section) Acute Rehab PT Goals Patient Stated Goal: Resume previous lifestyle with decreased pain PT Goal Formulation: With  patient Time For Goal Achievement: 03/22/13 Potential to Achieve Goals: Good  Visit Information  Last PT Received On: 03/15/13 Assistance Needed: +1 History of Present Illness: s/p R THA - anterior    Subjective Data  Patient Stated Goal: Resume previous lifestyle with decreased pain   Cognition  Cognition Arousal/Alertness: Awake/alert Behavior During Therapy: WFL for tasks assessed/performed Overall Cognitive Status: Within Functional Limits for tasks assessed    Balance  Balance Balance Assessed:  (WFL for ADL)  End of Session PT - End of Session Equipment Utilized During Treatment: Gait belt Activity Tolerance: Patient tolerated treatment well Patient left: in chair;with call bell/phone within reach;with family/visitor present Nurse Communication: Mobility status   GP     Jeffrey Mcmahon 03/15/2013, 1:56 PM

## 2013-03-15 NOTE — Progress Notes (Signed)
[facility]  [facility] is providing the following services: RW and Commode  If patient discharges after hours, please call [phone].   Renard Hamper [phone] 03/15/2013, 11:31 AM

## 2013-03-15 NOTE — Evaluation (Signed)
Physical Therapy Evaluation Patient Details Name: Jeffrey Mcmahon MRN: [id number] DOB: 04/04/1951 Today's Date: 03/15/2013 Time: 4098-1191 PT Time Calculation (min): 28 min  PT Assessment / Plan / Recommendation History of Present Illness     Clinical Impression  Pt s/p R THR presents with functional mobility limitations 2* decreased R LE strength/ROM and post op pain    PT Assessment  Patient needs continued PT services    Follow Up Recommendations  Home health PT    Does the patient have the potential to tolerate intense rehabilitation      Barriers to Discharge        Equipment Recommendations  Rolling walker with 5" wheels    Recommendations for Other Services OT consult   Frequency 7X/week    Precautions / Restrictions Precautions Precautions: Fall Restrictions Weight Bearing Restrictions: No Other Position/Activity Restrictions: WBAT   Pertinent Vitals/Pain 0/10 at rest, 7/10 with activity, ice pack provided, RN aware      Mobility  Bed Mobility Bed Mobility: Supine to Sit Supine to Sit: 4: Min guard Details for Bed Mobility Assistance: pt self assisting R LE with L LE Transfers Transfers: Sit to Stand;Stand to Sit Sit to Stand: 4: Min assist Stand to Sit: 4: Min assist Details for Transfer Assistance: cues for use of UEs and for LE management Ambulation/Gait Ambulation/Gait Assistance: 4: Min assist;4: Min Government social research officer (Feet): 400 Feet Assistive device: Rolling walker Ambulation/Gait Assistance Details: cues for initial sequence, posture and position from RW Gait Pattern: Step-to pattern;Step-through pattern;Antalgic Stairs: No    Exercises Total Joint Exercises Ankle Circles/Pumps: AROM;15 reps;Supine;Both Quad Sets: AROM;Both;10 reps;Supine Heel Slides: AAROM;15 reps;Supine;Right Hip ABduction/ADduction: AAROM;15 reps;Right;Supine   PT Diagnosis: Difficulty walking  PT Problem List: Decreased strength;Decreased range of  motion;Decreased activity tolerance;Decreased mobility;Decreased knowledge of use of DME;Pain PT Treatment Interventions: DME instruction;Gait training;Stair training;Functional mobility training;Therapeutic activities;Therapeutic exercise;Patient/family education     PT Goals(Current goals can be found in the care plan section) Acute Rehab PT Goals Patient Stated Goal: Resume previous lifestyle with decreased pain PT Goal Formulation: With patient Time For Goal Achievement: 03/22/13 Potential to Achieve Goals: Good  Visit Information  Last PT Received On: 03/15/13 Assistance Needed: +1       Prior Functioning  Home Living Family/patient expects to be discharged to:: Private residence Living Arrangements: Spouse/significant other;Non-relatives/Friends Available Help at Discharge: Family Type of Home: House Home Access: Stairs to enter Secretary/administrator of Steps: 1 Entrance Stairs-Rails: None Home Layout: One level Home Equipment: Crutches Prior Function Level of Independence: Independent Communication Communication: No difficulties    Cognition  Cognition Arousal/Alertness: Awake/alert Behavior During Therapy: WFL for tasks assessed/performed Overall Cognitive Status: Within Functional Limits for tasks assessed    Extremity/Trunk Assessment Upper Extremity Assessment Upper Extremity Assessment: Overall WFL for tasks assessed Lower Extremity Assessment Lower Extremity Assessment: RLE deficits/detail RLE Deficits / Details: Hip strength 2/5 with AAROM at hip to 90 flex and 20 abd   Balance    End of Session PT - End of Session Equipment Utilized During Treatment: Gait belt Activity Tolerance: Patient tolerated treatment well Patient left: in chair;with call bell/phone within reach;with family/visitor present Nurse Communication: Mobility status  GP     Jeffrey Mcmahon 03/15/2013, 9:43 AM

## 2013-03-15 NOTE — Care Management Note (Signed)
    Page 1 of 2   03/15/2013     5:29:18 PM   CARE MANAGEMENT NOTE 03/15/2013  Patient:  Jeffrey Mcmahon, Jeffrey Mcmahon   Account Number:  [id number]  Date Initiated:  03/15/2013  Documentation initiated by:  Colleen Can  Subjective/Objective Assessment:   dx total rt hip replacemnt-anterior approach     Action/Plan:   Plans to go bck home to [place], where spouse will be caregiver. Needs rw and 3n1.  [facility] will start services tomorrow   Anticipated DC Date:  03/15/2013   Anticipated DC Plan:  HOME W HOME HEALTH SERVICES      DC Planning Services  CM consult      PAC Choice  DURABLE MEDICAL EQUIPMENT  HOME HEALTH   Choice offered to / List presented to:  C-1 Patient   DME arranged  3-N-1  Levan Hurst      DME agency  [facility]     HH arranged  HH-2 PT      [facility] agency  [facility]   Status of service:  Completed, signed off Medicare Important Message given?   (If response is "NO", the following Medicare IM given date fields will be blank) Date Medicare IM given:   Date Additional Medicare IM given:    Discharge Disposition:  HOME W HOME HEALTH SERVICES  Per UR Regulation:  Reviewed for med. necessity/level of care/duration of stay  If discussed at Long Length of Stay Meetings, dates discussed:    Comments:

## 2013-03-15 NOTE — Progress Notes (Signed)
   Subjective: 1 Day Post-Op Procedure(s) (LRB): RIGHT TOTAL HIP ARTHROPLASTY ANTERIOR APPROACH (Right)   Patient reports pain as mild, pain well controlled. No events throughout the night. Ready to be discharged home.   Objective:   VITALS:   Filed Vitals:   03/15/13 0530  BP: 125/71  Pulse: 100  Temp: 98.2 F (36.8 C)  Resp: 20    Neurovascular intact Dorsiflexion/Plantar flexion intact Incision: dressing C/D/I No cellulitis present Compartment soft  LABS  Recent Labs  03/15/13 0450  HGB 13.2  HCT 35.3*  WBC 10.5  PLT 187     Recent Labs  03/15/13 0450  NA 138  K 4.0  BUN 9  CREATININE 0.78  GLUCOSE 150*     Assessment/Plan: 1 Day Post-Op Procedure(s) (LRB): RIGHT TOTAL HIP ARTHROPLASTY ANTERIOR APPROACH (Right) HV drain d/c'ed Foley cath d/c'ed Advance diet Up with therapy D/C IV fluids Discharge home with home health Follow up in 2 weeks at [facility]. Follow up with OLIN,Chyrel Taha D in 2 weeks.  Contact information:  [facility] [address], [address] [place] [address] [phone]    Overweight (BMI 25-29.9) Estimated body mass index is 26.65 kg/(m^2) as calculated from the following:   Height as of this encounter: 5\' 11"  (1.803 m).   Weight as of this encounter: 86.637 kg (191 lb). Patient also counseled that weight may inhibit the healing process Patient counseled that losing weight will help with future health issues      Jeffrey Mcmahon. Jeffrey Mcmahon   PAC  03/15/2013, 10:16 AM

## 2013-03-19 NOTE — Discharge Summary (Signed)
Physician Discharge Summary  Patient ID: Jeffrey Mcmahon MRN: [id number] DOB/AGE: 61-Aug-1952 61 y.o.  Admit date: 03/14/2013 Discharge date: 03/15/2013   Procedures:  Procedure(s) (LRB): RIGHT TOTAL HIP ARTHROPLASTY ANTERIOR APPROACH (Right)  Attending Physician:  Dr. Durene Romans   Admission Diagnoses:   Right hip OA / pain  Discharge Diagnoses:  Principal Problem:   S/P right THA, AA Active Problems:   Overweight (BMI 25.0-29.9)  Past Medical History  Diagnosis Date  . Pneumonia     December 2012  . Asthma   . Colon polyps   . Constipation   . GERD (gastroesophageal reflux disease)     occasional  . Arthritis   . Psoriasis     HPI: Jeffrey Mcmahon, 62 y.o. male, has a history of pain and functional disability in the right hip(s) due to trauma and patient has failed non-surgical conservative treatments for greater than 12 weeks to include NSAID's and/or analgesics and activity modification. Onset of symptoms was gradual starting 3 years ago with gradually worsening course since that time.The patient noted prior procedures of the hip to include opening the joint to scrap out infection on the right hip(s). Patient currently rates pain in the right hip at 10 out of 10 with activity. Patient has worsening of pain with activity and weight bearing, trendelenberg gait, pain that interfers with activities of daily living, pain with passive range of motion and when he first wakes in the morning. Patient has evidence of periarticular osteophytes and joint space narrowing by imaging studies. This condition presents safety issues increasing the risk of falls. There is no current active infection. Risks, benefits and expectations were discussed with the patient. Patient understand the risks, benefits and expectations and wishes to proceed with surgery.   PCP: Carylon Perches, MD   Discharged Condition: good  Hospital Course:  Patient underwent the above stated procedure on 03/14/2013. Patient tolerated  the procedure well and brought to the recovery room in good condition and subsequently to the floor.  POD #1 BP: 125/71 ; Pulse: 100 ; Temp: 98.2 F (36.8 C) ; Resp: 20  Pt's foley was removed, as well as the hemovac drain removed. IV was changed to a saline lock. Patient reports pain as mild, pain well controlled. No events throughout the night. Ready to be discharged home. Neurovascular intact, dorsiflexion/plantar flexion intact, incision: dressing C/D/I, no cellulitis present and compartment soft.   LABS  Basename    HGB  13.2  HCT  35.3    Discharge Exam: General appearance: alert, cooperative and no distress Extremities: Homans sign is negative, no sign of DVT, no edema, redness or tenderness in the calves or thighs and no ulcers, gangrene or trophic changes  Disposition:   Home-Health Care Svc with follow up in 2 weeks   Follow-up Information   Follow up with Shelda Pal, MD. Schedule an appointment as soon as possible for a visit in 2 weeks.   Contact information:   [address] [place] [address] [phone]       Discharge Orders   Future Orders Complete By Expires     Call MD / Call 911  As directed     Comments:      If you experience chest pain or shortness of breath, CALL 911 and be transported to the hospital emergency room.  If you develope a fever above 101 F, pus (white drainage) or increased drainage or redness at the wound, or calf pain, call your surgeon's  office.    Change dressing  As directed     Comments:      Maintain surgical dressing for 10-14 days, then replace with 4x4 guaze and tape. Keep the area dry and clean.    Constipation Prevention  As directed     Comments:      Drink plenty of fluids.  Prune juice may be helpful.  You may use a stool softener, such as Colace (over the counter) 100 mg twice a day.  Use MiraLax (over the counter) for constipation as needed.    Diet - low sodium heart healthy  As directed      Discharge instructions  As directed     Comments:      Maintain surgical dressing for 10-14 days, then replace with gauze and tape. Keep the area dry and clean until follow up. Follow up in 2 weeks at [facility]. Call with any questions or concerns.    Increase activity slowly as tolerated  As directed     TED hose  As directed     Comments:      Use stockings (TED hose) for 2 weeks on both leg(s).  You may remove them at night for sleeping.    Weight bearing as tolerated  As directed          Medication List    STOP taking these medications       HYDROcodone-acetaminophen 5-325 MG per tablet  Commonly known as:  NORCO/VICODIN  Replaced by:  HYDROcodone-acetaminophen 7.5-325 MG per tablet      TAKE these medications       albuterol 108 (90 BASE) MCG/ACT inhaler  Commonly known as:  PROVENTIL HFA;VENTOLIN HFA  Inhale 2 puffs into the lungs every 6 (six) hours as needed. For shortness of breath     aspirin 325 MG EC tablet  Take 1 tablet (325 mg total) by mouth 2 (two) times daily.     DSS 100 MG Caps  Take 100 mg by mouth 2 (two) times daily.     ferrous sulfate 325 (65 FE) MG tablet  Take 1 tablet (325 mg total) by mouth 3 (three) times daily after meals.     Fluticasone-Salmeterol 250-50 MCG/DOSE Aepb  Commonly known as:  ADVAIR  Inhale 1 puff into the lungs every other day.     HYDROcodone-acetaminophen 7.5-325 MG per tablet  Commonly known as:  NORCO  Take 1-2 tablets by mouth every 4 (four) hours as needed.     methocarbamol 500 MG tablet  Commonly known as:  ROBAXIN  Take 1 tablet (500 mg total) by mouth every 6 (six) hours as needed.     polyethylene glycol packet  Commonly known as:  MIRALAX / GLYCOLAX  Take 17 g by mouth 2 (two) times daily.     traMADol 50 MG tablet  Commonly known as:  ULTRAM  Take 50 mg by mouth 2 (two) times daily as needed for pain.         Signed: Anastasio Auerbach. Cecilia Vancleve   PAC  03/19/2013, 2:20 PM

## 2013-05-14 ENCOUNTER — Emergency Department (HOSPITAL_COMMUNITY)
Admission: EM | Admit: 2013-05-14 | Discharge: 2013-05-15 | Disposition: A | Discharge status: Home / Self Care | Payer: PRIVATE HEALTH INSURANCE | Attending: Emergency Medicine | Admitting: Emergency Medicine

## 2013-05-14 ENCOUNTER — Encounter (HOSPITAL_COMMUNITY): Payer: Self-pay | Admitting: Emergency Medicine

## 2013-05-14 DIAGNOSIS — Z8719 Personal history of other diseases of the digestive system: Secondary | ICD-10-CM | POA: Insufficient documentation

## 2013-05-14 DIAGNOSIS — Z872 Personal history of diseases of the skin and subcutaneous tissue: Secondary | ICD-10-CM | POA: Insufficient documentation

## 2013-05-14 DIAGNOSIS — Z9889 Other specified postprocedural states: Secondary | ICD-10-CM | POA: Insufficient documentation

## 2013-05-14 DIAGNOSIS — K5792 Diverticulitis of intestine, part unspecified, without perforation or abscess without bleeding: Secondary | ICD-10-CM

## 2013-05-14 DIAGNOSIS — Z8701 Personal history of pneumonia (recurrent): Secondary | ICD-10-CM | POA: Insufficient documentation

## 2013-05-14 DIAGNOSIS — K5732 Diverticulitis of large intestine without perforation or abscess without bleeding: Secondary | ICD-10-CM | POA: Insufficient documentation

## 2013-05-14 DIAGNOSIS — Z860100 Personal history of colon polyps, unspecified: Secondary | ICD-10-CM | POA: Insufficient documentation

## 2013-05-14 DIAGNOSIS — J45909 Unspecified asthma, uncomplicated: Secondary | ICD-10-CM | POA: Insufficient documentation

## 2013-05-14 DIAGNOSIS — IMO0002 Reserved for concepts with insufficient information to code with codable children: Secondary | ICD-10-CM | POA: Insufficient documentation

## 2013-05-14 DIAGNOSIS — M129 Arthropathy, unspecified: Secondary | ICD-10-CM | POA: Insufficient documentation

## 2013-05-14 DIAGNOSIS — Z79899 Other long term (current) drug therapy: Secondary | ICD-10-CM | POA: Insufficient documentation

## 2013-05-14 HISTORY — DX: Diverticulitis of intestine, part unspecified, without perforation or abscess without bleeding: K57.92

## 2013-05-14 NOTE — ED Notes (Signed)
Patient complaining of lower left abdominal pain since yesterday.

## 2013-05-15 ENCOUNTER — Emergency Department (HOSPITAL_COMMUNITY): Payer: PRIVATE HEALTH INSURANCE

## 2013-05-15 LAB — URINALYSIS, ROUTINE W REFLEX MICROSCOPIC
Glucose, UA: NEGATIVE mg/dL
Hgb urine dipstick: NEGATIVE
Ketones, ur: NEGATIVE mg/dL
Leukocytes, UA: NEGATIVE
Protein, ur: NEGATIVE mg/dL
pH: 6 (ref 5.0–8.0)

## 2013-05-15 LAB — CBC WITH DIFFERENTIAL/PLATELET
Eosinophils Absolute: 0.2 10*3/uL (ref 0.0–0.7)
Eosinophils Relative: 3 % (ref 0–5)
HCT: 37.1 % — ABNORMAL LOW (ref 39.0–52.0)
Hemoglobin: 13.4 g/dL (ref 13.0–17.0)
Lymphocytes Relative: 16 % (ref 12–46)
Lymphs Abs: 1.1 10*3/uL (ref 0.7–4.0)
MCH: 31.2 pg (ref 26.0–34.0)
MCV: 86.3 fL (ref 78.0–100.0)
Monocytes Absolute: 0.7 10*3/uL (ref 0.1–1.0)
Monocytes Relative: 10 % (ref 3–12)
RBC: 4.3 MIL/uL (ref 4.22–5.81)
WBC: 6.8 10*3/uL (ref 4.0–10.5)

## 2013-05-15 LAB — COMPREHENSIVE METABOLIC PANEL
ALT: 13 U/L (ref 0–53)
AST: 16 U/L (ref 0–37)
Albumin: 3.7 g/dL (ref 3.5–5.2)
CO2: 28 mEq/L (ref 19–32)
Calcium: 9.3 mg/dL (ref 8.4–10.5)
Creatinine, Ser: 0.83 mg/dL (ref 0.50–1.35)
GFR calc non Af Amer: >90 mL/min (ref >90)
Sodium: 139 mEq/L (ref 135–145)
Total Protein: 6.4 g/dL (ref 6.0–8.3)

## 2013-05-15 MED ORDER — ONDANSETRON HCL 4 MG/2ML IJ SOLN
4.0000 mg | Freq: Once | INTRAMUSCULAR | Status: AC
  Administered 2013-05-15: 4 mg via INTRAVENOUS
  Filled 2013-05-15: qty 2

## 2013-05-15 MED ORDER — AMOXICILLIN-POT CLAVULANATE 875-125 MG PO TABS: 1.0000 | ORAL_TABLET | Freq: Two times a day (BID) | ORAL | Status: DC

## 2013-05-15 MED ORDER — SODIUM CHLORIDE 0.9 % IV BOLUS (SEPSIS)
500.0000 mL | Freq: Once | INTRAVENOUS | Status: AC
  Administered 2013-05-15: 01:00:00 via INTRAVENOUS
  Administered 2013-05-15: 500 mL via INTRAVENOUS

## 2013-05-15 MED ORDER — SODIUM CHLORIDE 0.9 % IV SOLN
3.0000 g | Freq: Once | INTRAVENOUS | Status: AC
  Administered 2013-05-15: 3 g via INTRAVENOUS
  Filled 2013-05-15: qty 3

## 2013-05-15 MED ORDER — TRAMADOL HCL 50 MG PO TABS: 50.0000 mg | ORAL_TABLET | Freq: Four times a day (QID) | ORAL | Status: DC | PRN

## 2013-05-15 MED ORDER — IOHEXOL 300 MG/ML  SOLN
100.0000 mL | Freq: Once | INTRAMUSCULAR | Status: AC | PRN
  Administered 2013-05-15: 100 mL via INTRAVENOUS

## 2013-05-15 MED ORDER — ONDANSETRON 4 MG PO TBDP: 4.0000 mg | ORAL_TABLET | Freq: Three times a day (TID) | ORAL | Status: DC | PRN

## 2013-05-15 MED ORDER — HYDROMORPHONE HCL PF 1 MG/ML IJ SOLN
1.0000 mg | Freq: Once | INTRAMUSCULAR | Status: AC
  Administered 2013-05-15: 1 mg via INTRAVENOUS
  Filled 2013-05-15: qty 1

## 2013-05-15 MED ORDER — IOHEXOL 300 MG/ML  SOLN
50.0000 mL | Freq: Once | INTRAMUSCULAR | Status: AC | PRN
  Administered 2013-05-15: 50 mL via ORAL

## 2013-05-15 MED ORDER — SODIUM CHLORIDE 0.9 % IV SOLN
INTRAVENOUS | Status: DC
  Administered 2013-05-15: 04:00:00 via INTRAVENOUS

## 2013-05-15 NOTE — ED Notes (Signed)
Patient in room pulling off leads and cursing at p.Virgin Zellers,rn. Dr.zackoski walked in room and talked to patient.

## 2013-05-15 NOTE — ED Notes (Signed)
Went into patients room and wife stated she did not want nurse back in roo. Jefferson Fuel now taking over patient care.

## 2013-05-15 NOTE — ED Provider Notes (Signed)
CSN: [id number]     Arrival date & time 05/14/13  2221 History   First MD Initiated Contact with Patient 05/15/13 0044     Chief Complaint  Patient presents with  . Abdominal Pain   (Consider location/radiation/quality/duration/timing/severity/associated sxs/prior Treatment) Patient is a 62 y.o. male presenting with abdominal pain. The history is provided by the patient.  Abdominal Pain Associated symptoms: no chest pain, no diarrhea, no dysuria, no fever, no nausea, no shortness of breath and no vomiting    patient with onset of left-sided abdominal pain yesterday. That would be Saturday. It has persisted. Pain is currently 5/10. No nausea vomiting or diarrhea. Patient is a sharp ache reminds patient when he had diverticulitis in the past. It is nonradiating not made better or worse by anything.  Past Medical History  Diagnosis Date  . Pneumonia     December 2012  . Asthma   . Colon polyps   . Constipation   . GERD (gastroesophageal reflux disease)     occasional  . Arthritis   . Psoriasis   . Diverticulitis    Past Surgical History  Procedure Laterality Date  . Right hip surgery  1993    Secondary to a staph infection  . Hernia repair  1993  . Colonoscopy  07/29/2011    Procedure: COLONOSCOPY;  Surgeon: Malissa Hippo, MD;  Location: [facility];  Service: Endoscopy;  Laterality: N/A;  7:30 am  . Total hip arthroplasty Right 03/14/2013    Procedure: RIGHT TOTAL HIP ARTHROPLASTY ANTERIOR APPROACH;  Surgeon: Shelda Pal, MD;  Location: [facility];  Service: Orthopedics;  Laterality: Right;   Family History  Problem Relation Age of Onset  . Colon cancer Neg Hx   . Diabetes Father   . Heart attack Father    History  Substance Use Topics  . Smoking status: Never Smoker   . Smokeless tobacco: Never Used  . Alcohol Use: No    Review of Systems  Constitutional: Negative for fever.  HENT: Negative for congestion.   Eyes: Negative for redness.  Respiratory: Negative for  shortness of breath.   Cardiovascular: Negative for chest pain.  Gastrointestinal: Positive for abdominal pain. Negative for nausea, vomiting and diarrhea.  Genitourinary: Negative for dysuria.  Musculoskeletal: Negative for back pain.  Skin: Negative for rash.  Neurological: Negative for headaches.  Hematological: Does not bruise/bleed easily.  Psychiatric/Behavioral: Negative for confusion.    Allergies  Codeine  Home Medications   Current Outpatient Rx  Name  Route  Sig  Dispense  Refill  . albuterol (PROVENTIL HFA;VENTOLIN HFA) 108 (90 BASE) MCG/ACT inhaler   Inhalation   Inhale 2 puffs into the lungs every 6 (six) hours as needed. For shortness of breath          . amoxicillin-clavulanate (AUGMENTIN) 875-125 MG per tablet   Oral   Take 1 tablet by mouth every 12 (twelve) hours.   20 tablet   0   . docusate sodium 100 MG CAPS   Oral   Take 100 mg by mouth 2 (two) times daily.   10 capsule   0   . ferrous sulfate 325 (65 FE) MG tablet   Oral   Take 1 tablet (325 mg total) by mouth 3 (three) times daily after meals.      3   . Fluticasone-Salmeterol (ADVAIR) 250-50 MCG/DOSE AEPB   Inhalation   Inhale 1 puff into the lungs every other day.         Marland Kitchen  HYDROcodone-acetaminophen (NORCO) 7.5-325 MG per tablet   Oral   Take 1-2 tablets by mouth every 4 (four) hours as needed.   100 tablet   0   . methocarbamol (ROBAXIN) 500 MG tablet   Oral   Take 1 tablet (500 mg total) by mouth every 6 (six) hours as needed.         . ondansetron (ZOFRAN ODT) 4 MG disintegrating tablet   Oral   Take 1 tablet (4 mg total) by mouth every 8 (eight) hours as needed.   10 tablet   0   . polyethylene glycol (MIRALAX / GLYCOLAX) packet   Oral   Take 17 g by mouth 2 (two) times daily.   14 each   0   . traMADol (ULTRAM) 50 MG tablet   Oral   Take 50 mg by mouth 2 (two) times daily as needed for pain.         . traMADol (ULTRAM) 50 MG tablet   Oral   Take 1 tablet  (50 mg total) by mouth every 6 (six) hours as needed for pain.   15 tablet   0    BP 135/77  Pulse 76  Temp(Src) 97.7 F (36.5 C) (Oral)  Resp 20  Ht 5\' 11"  (1.803 m)  Wt 195 lb (88.451 kg)  BMI 27.21 kg/m2  SpO2 96% Physical Exam  Nursing note and vitals reviewed. Constitutional: He is oriented to person, place, and time. He appears well-developed and well-nourished. No distress.  HENT:  Head: Normocephalic and atraumatic.  Mouth/Throat: Oropharynx is clear and moist.  Eyes: Conjunctivae and EOM are normal. Pupils are equal, round, and reactive to light.  Neck: Normal range of motion.  Cardiovascular: Normal rate, regular rhythm and normal heart sounds.   No murmur heard. Pulmonary/Chest: Effort normal and breath sounds normal. No respiratory distress.  Abdominal: Soft. Bowel sounds are normal. There is tenderness.  Mild tenderness left side of the abdomen no guarding.  Musculoskeletal: Normal range of motion. He exhibits no edema.  Neurological: He is alert and oriented to person, place, and time. No cranial nerve deficit. He exhibits normal muscle tone. Coordination normal.  Skin: Skin is warm. No rash noted.    ED Course  Procedures (including critical care time) Labs Review Labs Reviewed  COMPREHENSIVE METABOLIC PANEL - Abnormal; Notable for the following:    Glucose, Bld 122 (*)    All other components within normal limits  CBC WITH DIFFERENTIAL - Abnormal; Notable for the following:    HCT 37.1 (*)    MCHC 36.1 (*)    All other components within normal limits  LIPASE, BLOOD  URINALYSIS, ROUTINE W REFLEX MICROSCOPIC   Results for orders placed during the hospital encounter of 05/14/13  COMPREHENSIVE METABOLIC PANEL      Result Value Range   Sodium 139  135 - 145 mEq/L   Potassium 3.6  3.5 - 5.1 mEq/L   Chloride 101  96 - 112 mEq/L   CO2 28  19 - 32 mEq/L   Glucose, Bld 122 (*) 70 - 99 mg/dL   BUN 13  6 - 23 mg/dL   Creatinine, Ser 1.61  0.50 - 1.35 mg/dL    Calcium 9.3  8.4 - 09.6 mg/dL   Total Protein 6.4  6.0 - 8.3 g/dL   Albumin 3.7  3.5 - 5.2 g/dL   AST 16  0 - 37 U/L   ALT 13  0 - 53 U/L   Alkaline Phosphatase  54  39 - 117 U/L   Total Bilirubin 0.4  0.3 - 1.2 mg/dL   GFR calc non Af Amer >90  >90 mL/min   GFR calc Af Amer >90  >90 mL/min  LIPASE, BLOOD      Result Value Range   Lipase 36  11 - 59 U/L  CBC WITH DIFFERENTIAL      Result Value Range   WBC 6.8  4.0 - 10.5 K/uL   RBC 4.30  4.22 - 5.81 MIL/uL   Hemoglobin 13.4  13.0 - 17.0 g/dL   HCT 40.9 (*) 81.1 - 91.4 %   MCV 86.3  78.0 - 100.0 fL   MCH 31.2  26.0 - 34.0 pg   MCHC 36.1 (*) 30.0 - 36.0 g/dL   RDW 78.2  95.6 - 21.3 %   Platelets 199  150 - 400 K/uL   Neutrophils Relative % 71  43 - 77 %   Neutro Abs 4.8  1.7 - 7.7 K/uL   Lymphocytes Relative 16  12 - 46 %   Lymphs Abs 1.1  0.7 - 4.0 K/uL   Monocytes Relative 10  3 - 12 %   Monocytes Absolute 0.7  0.1 - 1.0 K/uL   Eosinophils Relative 3  0 - 5 %   Eosinophils Absolute 0.2  0.0 - 0.7 K/uL   Basophils Relative 0  0 - 1 %   Basophils Absolute 0.0  0.0 - 0.1 K/uL  URINALYSIS, ROUTINE W REFLEX MICROSCOPIC      Result Value Range   Color, Urine YELLOW  YELLOW   APPearance CLEAR  CLEAR   Specific Gravity, Urine 1.025  1.005 - 1.030   pH 6.0  5.0 - 8.0   Glucose, UA NEGATIVE  NEGATIVE mg/dL   Hgb urine dipstick NEGATIVE  NEGATIVE   Bilirubin Urine NEGATIVE  NEGATIVE   Ketones, ur NEGATIVE  NEGATIVE mg/dL   Protein, ur NEGATIVE  NEGATIVE mg/dL   Urobilinogen, UA 0.2  0.0 - 1.0 mg/dL   Nitrite NEGATIVE  NEGATIVE   Leukocytes, UA NEGATIVE  NEGATIVE    Imaging Review Ct Abdomen Pelvis W Contrast  05/15/2013   *RADIOLOGY REPORT*  Clinical Data: Left lower quadrant abdominal pain.  CT ABDOMEN AND PELVIS WITH CONTRAST  Technique:  Multidetector CT imaging of the abdomen and pelvis was performed following the standard protocol during bolus administration of intravenous contrast.  Contrast: 100 mL of Omnipaque 300 IV  contrast  Comparison: Pelvic radiograph performed 03/14/2013  Findings: Mild bibasilar atelectasis is noted.  The liver and spleen are unremarkable in appearance.  Multiple stones are noted within the gallbladder; the gallbladder is otherwise unremarkable in appearance.  The pancreas and adrenal glands are unremarkable.  Nonspecific perinephric stranding is noted bilaterally.  There is question of a small angiomyolipoma at the left kidney.  The kidneys are otherwise unremarkable in appearance.  There is no evidence of hydronephrosis.  No renal or ureteral stones are seen.  No free fluid is identified.  The small bowel is unremarkable in appearance.  The stomach is within normal limits.  No acute vascular abnormalities are seen.  There is colonic wall thickening along the descending colon, with associated soft tissue inflammation and trace fluid, compatible with acute diverticulitis.  There is no definite evidence of perforation or abscess formation at this time.  Diffuse diverticulosis is noted along the descending and sigmoid colon, with more mild diverticulosis noted along the ascending and transverse colon.  The appendix is normal  in caliber, without evidence for appendicitis.  The bladder is mildly distended and unremarkable in appearance.  A small urachal remnant is incidentally noted.  The prostate is borderline normal in size, with minimal calcification.  No inguinal lymphadenopathy is seen.  No acute osseous abnormalities are identified. A right-sided hip prosthesis is incompletely imaged but appears grossly unremarkable.  IMPRESSION:  1.  Acute diverticulitis noted along the descending colon, with associated soft tissue inflammation, colonic wall thickening and trace fluid.  No definite evidence of perforation or abscess formation at this time. 2.  Diffuse diverticulosis along the entirety of the colon, most prominent along the descending and sigmoid colon. 3.  Cholelithiasis noted; gallbladder otherwise  unremarkable in appearance. 4.  Mild bibasilar atelectasis noted.   Original Report Authenticated By: Tonia Ghent, M.D.    MDM   1. Diverticulitis    CT scan and clinical findings consistent with uncomplicated diverticulitis. Patient treated with Unasyn IV piggyback in the emergency part will be continued on Keflex. Patient will followup with primary care Dr. in next few days. This is patient's third episode of diverticulitis consideration for possible resection should be entertained. No complicating factors today patient's nontoxic no acute distress. No sniffing and leukocytosis on febrile no significant electrolyte abnormalities.    Shelda Jakes, MD 05/15/13 [phone]

## 2013-05-15 NOTE — ED Notes (Signed)
[facility] pd in room talking to patient and explained to not to curse at staff.

## 2014-08-13 ENCOUNTER — Encounter (HOSPITAL_COMMUNITY): Payer: Self-pay | Admitting: Emergency Medicine

## 2014-08-13 ENCOUNTER — Emergency Department (HOSPITAL_COMMUNITY): Payer: Self-pay

## 2014-08-13 ENCOUNTER — Emergency Department (HOSPITAL_COMMUNITY)
Admission: EM | Admit: 2014-08-13 | Discharge: 2014-08-13 | Disposition: A | Discharge status: Home / Self Care | Payer: Self-pay | Attending: Emergency Medicine | Admitting: Emergency Medicine

## 2014-08-13 DIAGNOSIS — K8020 Calculus of gallbladder without cholecystitis without obstruction: Secondary | ICD-10-CM

## 2014-08-13 DIAGNOSIS — Z8701 Personal history of pneumonia (recurrent): Secondary | ICD-10-CM | POA: Insufficient documentation

## 2014-08-13 DIAGNOSIS — Z9889 Other specified postprocedural states: Secondary | ICD-10-CM | POA: Insufficient documentation

## 2014-08-13 DIAGNOSIS — Z79899 Other long term (current) drug therapy: Secondary | ICD-10-CM | POA: Insufficient documentation

## 2014-08-13 DIAGNOSIS — R109 Unspecified abdominal pain: Secondary | ICD-10-CM

## 2014-08-13 DIAGNOSIS — M1990 Unspecified osteoarthritis, unspecified site: Secondary | ICD-10-CM | POA: Insufficient documentation

## 2014-08-13 DIAGNOSIS — Z792 Long term (current) use of antibiotics: Secondary | ICD-10-CM | POA: Insufficient documentation

## 2014-08-13 DIAGNOSIS — K8050 Calculus of bile duct without cholangitis or cholecystitis without obstruction: Secondary | ICD-10-CM | POA: Insufficient documentation

## 2014-08-13 DIAGNOSIS — Z8719 Personal history of other diseases of the digestive system: Secondary | ICD-10-CM | POA: Insufficient documentation

## 2014-08-13 DIAGNOSIS — Z86010 Personal history of colonic polyps: Secondary | ICD-10-CM | POA: Insufficient documentation

## 2014-08-13 DIAGNOSIS — R52 Pain, unspecified: Secondary | ICD-10-CM

## 2014-08-13 DIAGNOSIS — Z7952 Long term (current) use of systemic steroids: Secondary | ICD-10-CM | POA: Insufficient documentation

## 2014-08-13 DIAGNOSIS — Z791 Long term (current) use of non-steroidal anti-inflammatories (NSAID): Secondary | ICD-10-CM | POA: Insufficient documentation

## 2014-08-13 HISTORY — DX: Pneumothorax, unspecified: J93.9

## 2014-08-13 LAB — CBC WITH DIFFERENTIAL/PLATELET
BASOS ABS: 0 10*3/uL (ref 0.0–0.1)
Basophils Relative: 0 % (ref 0–1)
Eosinophils Absolute: 0.3 10*3/uL (ref 0.0–0.7)
Eosinophils Relative: 6 % — ABNORMAL HIGH (ref 0–5)
HCT: 43.8 % (ref 39.0–52.0)
Hemoglobin: 15.6 g/dL (ref 13.0–17.0)
LYMPHS PCT: 28 % (ref 12–46)
Lymphs Abs: 1.5 10*3/uL (ref 0.7–4.0)
MCH: 30.9 pg (ref 26.0–34.0)
MCHC: 35.6 g/dL (ref 30.0–36.0)
MCV: 86.7 fL (ref 78.0–100.0)
Monocytes Absolute: 0.4 10*3/uL (ref 0.1–1.0)
Monocytes Relative: 8 % (ref 3–12)
NEUTROS ABS: 3.1 10*3/uL (ref 1.7–7.7)
NEUTROS PCT: 58 % (ref 43–77)
PLATELETS: 211 10*3/uL (ref 150–400)
RBC: 5.05 MIL/uL (ref 4.22–5.81)
RDW: 13.2 % (ref 11.5–15.5)
WBC: 5.4 10*3/uL (ref 4.0–10.5)

## 2014-08-13 LAB — COMPREHENSIVE METABOLIC PANEL
ALK PHOS: 43 U/L (ref 39–117)
ALT: 22 U/L (ref 0–53)
ANION GAP: 4 — AB (ref 5–15)
AST: 24 U/L (ref 0–37)
Albumin: 4.5 g/dL (ref 3.5–5.2)
BILIRUBIN TOTAL: 0.8 mg/dL (ref 0.3–1.2)
BUN: 20 mg/dL (ref 6–23)
CALCIUM: 9.1 mg/dL (ref 8.4–10.5)
CO2: 27 mmol/L (ref 19–32)
CREATININE: 1.03 mg/dL (ref 0.50–1.35)
Chloride: 107 mEq/L (ref 96–112)
GFR, EST AFRICAN AMERICAN: 88 mL/min — AB (ref >90)
GFR, EST NON AFRICAN AMERICAN: 76 mL/min — AB (ref >90)
Glucose, Bld: 87 mg/dL (ref 70–99)
Potassium: 3.8 mmol/L (ref 3.5–5.1)
Sodium: 138 mmol/L (ref 135–145)
TOTAL PROTEIN: 6.9 g/dL (ref 6.0–8.3)

## 2014-08-13 LAB — URINALYSIS, ROUTINE W REFLEX MICROSCOPIC
Bilirubin Urine: NEGATIVE
GLUCOSE, UA: NEGATIVE mg/dL
HGB URINE DIPSTICK: NEGATIVE
KETONES UR: NEGATIVE mg/dL
LEUKOCYTES UA: NEGATIVE
Nitrite: NEGATIVE
PH: 6.5 (ref 5.0–8.0)
Protein, ur: NEGATIVE mg/dL
SPECIFIC GRAVITY, URINE: 1.02 (ref 1.005–1.030)
Urobilinogen, UA: 0.2 mg/dL (ref 0.0–1.0)

## 2014-08-13 MED ORDER — OXYCODONE-ACETAMINOPHEN 5-325 MG PO TABS: 1.0000 | ORAL_TABLET | Freq: Four times a day (QID) | ORAL | Status: DC | PRN

## 2014-08-13 MED ORDER — NAPROXEN 500 MG PO TABS: 500.0000 mg | ORAL_TABLET | Freq: Two times a day (BID) | ORAL | Status: DC

## 2014-08-13 MED ORDER — KETOROLAC TROMETHAMINE 30 MG/ML IJ SOLN
30.0000 mg | Freq: Once | INTRAMUSCULAR | Status: AC
  Administered 2014-08-13: 30 mg via INTRAVENOUS
  Filled 2014-08-13: qty 1

## 2014-08-13 MED ORDER — ONDANSETRON HCL 4 MG/2ML IJ SOLN
4.0000 mg | Freq: Once | INTRAMUSCULAR | Status: AC
  Administered 2014-08-13: 4 mg via INTRAVENOUS
  Filled 2014-08-13: qty 2

## 2014-08-13 MED ORDER — HYDROMORPHONE HCL 1 MG/ML IJ SOLN
1.0000 mg | Freq: Once | INTRAMUSCULAR | Status: AC
  Administered 2014-08-13: 1 mg via INTRAVENOUS
  Filled 2014-08-13: qty 1

## 2014-08-13 MED ORDER — ONDANSETRON 4 MG PO TBDP
ORAL_TABLET | ORAL | Status: DC
Start: 2014-08-13 — End: 2016-08-14

## 2014-08-13 NOTE — Discharge Instructions (Signed)
Follow up with dr. Willey Blade in 2-3 days for recheck.   Return if worsening.   Rest for 2 day

## 2014-08-13 NOTE — ED Provider Notes (Signed)
CSN: [id number]     Arrival date & time 08/13/14  1552 History   First MD Initiated Contact with Patient 08/13/14 1720     Chief Complaint  Patient presents with  . Flank Pain     (Consider location/radiation/quality/duration/timing/severity/associated sxs/prior Treatment) Patient is a 64 y.o. male presenting with flank pain. The history is provided by the patient (the pt complains of right flank pain).  Flank Pain This is a new problem. The current episode started yesterday. The problem occurs constantly. The problem has not changed since onset.Pertinent negatives include no chest pain, no abdominal pain and no headaches. The symptoms are aggravated by twisting. Nothing relieves the symptoms.    Past Medical History  Diagnosis Date  . Pneumonia     December 2012  . Asthma   . Colon polyps   . Constipation   . GERD (gastroesophageal reflux disease)     occasional  . Arthritis   . Psoriasis   . Diverticulitis   . Pneumothorax    Past Surgical History  Procedure Laterality Date  . Right hip surgery  1993    Secondary to a staph infection  . Hernia repair  1993  . Colonoscopy  07/29/2011    Procedure: COLONOSCOPY;  Surgeon: Rogene Houston, MD;  Location: [facility];  Service: Endoscopy;  Laterality: N/A;  7:30 am  . Total hip arthroplasty Right 03/14/2013    Procedure: RIGHT TOTAL HIP ARTHROPLASTY ANTERIOR APPROACH;  Surgeon: Mauri Pole, MD;  Location: [facility];  Service: Orthopedics;  Laterality: Right;   Family History  Problem Relation Age of Onset  . Colon cancer Neg Hx   . Diabetes Father   . Heart attack Father    History  Substance Use Topics  . Smoking status: Never Smoker   . Smokeless tobacco: Never Used  . Alcohol Use: No    Review of Systems  Constitutional: Negative for appetite change and fatigue.  HENT: Negative for congestion, ear discharge and sinus pressure.   Eyes: Negative for discharge.  Respiratory: Negative for cough.   Cardiovascular:  Negative for chest pain.  Gastrointestinal: Negative for abdominal pain and diarrhea.  Genitourinary: Positive for flank pain. Negative for frequency and hematuria.  Musculoskeletal: Negative for back pain.  Skin: Negative for rash.  Neurological: Negative for seizures and headaches.  Psychiatric/Behavioral: Negative for hallucinations.      Allergies  Codeine  Home Medications   Prior to Admission medications   Medication Sig Start Date End Date Taking? Authorizing Provider  albuterol (PROVENTIL HFA;VENTOLIN HFA) 108 (90 BASE) MCG/ACT inhaler Inhale 2 puffs into the lungs every 6 (six) hours as needed. For shortness of breath    Yes Historical Provider, MD  Fluticasone-Salmeterol (ADVAIR) 250-50 MCG/DOSE AEPB Inhale 1 puff into the lungs every other day.   Yes Historical Provider, MD  amoxicillin-clavulanate (AUGMENTIN) 875-125 MG per tablet Take 1 tablet by mouth every 12 (twelve) hours. Patient not taking: Reported on 08/13/2014 05/15/13   Fredia Sorrow, MD  docusate sodium 100 MG CAPS Take 100 mg by mouth 2 (two) times daily. Patient not taking: Reported on 08/13/2014 03/15/13   Lucille Passy Babish, PA-C  ferrous sulfate 325 (65 FE) MG tablet Take 1 tablet (325 mg total) by mouth 3 (three) times daily after meals. Patient not taking: Reported on 08/13/2014 03/15/13   Lucille Passy Babish, PA-C  HYDROcodone-acetaminophen [facility] [place]) 7.5-325 MG per tablet Take 1-2 tablets by mouth every 4 (four) hours as needed. Patient not taking: Reported on  08/13/2014 03/15/13   Lucille Passy Babish, PA-C  methocarbamol (ROBAXIN) 500 MG tablet Take 1 tablet (500 mg total) by mouth every 6 (six) hours as needed. Patient not taking: Reported on 08/13/2014 03/15/13   Lucille Passy Babish, PA-C  naproxen (NAPROSYN) 500 MG tablet Take 1 tablet (500 mg total) by mouth 2 (two) times daily. 08/13/14   Maudry Diego, MD  ondansetron (ZOFRAN ODT) 4 MG disintegrating tablet 4mg  ODT q4 hours prn nausea/vomit 08/13/14   Maudry Diego, MD  oxyCODONE-acetaminophen (PERCOCET/ROXICET) 5-325 MG per tablet Take 1 tablet by mouth every 6 (six) hours as needed. 08/13/14   Maudry Diego, MD  polyethylene glycol (MIRALAX / GLYCOLAX) packet Take 17 g by mouth 2 (two) times daily. Patient not taking: Reported on 08/13/2014 03/15/13   Lucille Passy Babish, PA-C  traMADol (ULTRAM) 50 MG tablet Take 1 tablet (50 mg total) by mouth every 6 (six) hours as needed for pain. Patient not taking: Reported on 08/13/2014 05/15/13   Fredia Sorrow, MD   BP 134/84 mmHg  Pulse 62  Temp(Src) 98.1 F (36.7 C) (Oral)  Resp 18  Ht 5\' 11"  (1.803 m)  Wt 198 lb (89.812 kg)  BMI 27.63 kg/m2  SpO2 96% Physical Exam  Constitutional: He is oriented to person, place, and time. He appears well-developed.  HENT:  Head: Normocephalic.  Eyes: Conjunctivae and EOM are normal. No scleral icterus.  Neck: Neck supple. No thyromegaly present.  Cardiovascular: Normal rate and regular rhythm.  Exam reveals no gallop and no friction rub.   No murmur heard. Pulmonary/Chest: No stridor. He has no wheezes. He has no rales. He exhibits no tenderness.  Abdominal: He exhibits no distension. There is no tenderness. There is no rebound.  Musculoskeletal: Normal range of motion. He exhibits no edema.  Moderate right flank tenderness  Lymphadenopathy:    He has no cervical adenopathy.  Neurological: He is oriented to person, place, and time. He exhibits normal muscle tone. Coordination normal.  Skin: No rash noted. No erythema.  Psychiatric: He has a normal mood and affect. His behavior is normal.    ED Course  Procedures (including critical care time) Labs Review Labs Reviewed  CBC WITH DIFFERENTIAL - Abnormal; Notable for the following:    Eosinophils Relative 6 (*)    All other components within normal limits  COMPREHENSIVE METABOLIC PANEL - Abnormal; Notable for the following:    GFR calc non Af Amer 76 (*)    GFR calc Af Amer 88 (*)    Anion gap 4 (*)     All other components within normal limits  URINALYSIS, ROUTINE W REFLEX MICROSCOPIC    Imaging Review Ct Renal Stone Study  08/13/2014   CLINICAL DATA:  Initial encounter for right flank pain beginning this morning.  EXAM: CT ABDOMEN AND PELVIS WITHOUT CONTRAST  TECHNIQUE: Multidetector CT imaging of the abdomen and pelvis was performed following the standard protocol without IV contrast.  COMPARISON:  05/15/2013  FINDINGS: Lower chest:  Subsegmental atelectasis seen in the right lung base.  Hepatobiliary: Insert normal uninfused the liver numerous stones are seen in the lumen of the gallbladder. No pericholecystic fluid. No intrahepatic or extrahepatic biliary dilation.  Pancreas: No focal mass lesion. No dilatation of the main duct. No intraparenchymal cyst. No peripancreatic edema.  Spleen: No splenomegaly. No focal mass lesion.  Adrenals/Urinary Tract: No adrenal nodule or mass. No stones in either kidney. No secondary changes in either kidney. No ureteral stones or evidence  of hydroureter. There is some streak artifact in the inferior pelvis emanating from the right hip replacement, but no stones are visible in the bladder.  Stomach/Bowel: Stomach is nondistended. No gastric wall thickening. No evidence of outlet obstruction. Duodenum is normally positioned as is the ligament of Treitz. No small bowel wall thickening. No small bowel dilatation. Terminal ileum is normal. The appendix is normal. Diverticuli are seen scattered along the entire length of the colon without CT findings of diverticulitis.  Vascular/Lymphatic: Atherosclerotic calcification is noted in the wall of the abdominal aorta without aneurysm. No lymphadenopathy in the abdomen. There are some small lymph nodes in the central small bowel mesentery with some associated haziness of the surrounding fat. This is a nonspecific finding and is unchanged in the interval. No pelvic sidewall lymphadenopathy.  Reproductive: Prostate gland and seminal  vesicles are unremarkable.  Other: No intraperitoneal free fluid.  Musculoskeletal: Patient is status post right hip replacement. Bone windows reveal no worrisome lytic or sclerotic osseous lesions.  IMPRESSION: 1. No findings on today's study to explain the patient's history of right flank pain. 2. Interval resolution of left-sided diverticulitis seen previously. No evidence for acute diverticulitis on the current study. 3. Cholelithiasis 4. Atherosclerosis.   Electronically Signed   By: Misty Stanley M.D.   On: 08/13/2014 19:00     EKG Interpretation None      MDM   Final diagnoses:  Pain  Flank pain  Gall stones    Flank pain,   Nl ct,  Will tx for muscular skeletal pain and follow up with pcp in 2-3 days.   Also follow up for gall stones    Maudry Diego, MD 08/13/14 2040

## 2014-08-13 NOTE — ED Notes (Addendum)
PT reports sudden onset of right flank pain this am around 1000. PT denies any noticeable blood in urine or any other urinary symptoms. PT denies any hx of kidney stones. PT denies any recent injury or heavy lifting.

## 2014-08-15 ENCOUNTER — Emergency Department (HOSPITAL_COMMUNITY)
Admission: EM | Admit: 2014-08-15 | Discharge: 2014-08-15 | Disposition: A | Discharge status: Home / Self Care | Payer: Self-pay | Attending: Emergency Medicine | Admitting: Emergency Medicine

## 2014-08-15 ENCOUNTER — Encounter (HOSPITAL_COMMUNITY): Payer: Self-pay

## 2014-08-15 ENCOUNTER — Emergency Department (HOSPITAL_COMMUNITY): Payer: Self-pay

## 2014-08-15 DIAGNOSIS — M1990 Unspecified osteoarthritis, unspecified site: Secondary | ICD-10-CM | POA: Insufficient documentation

## 2014-08-15 DIAGNOSIS — Z792 Long term (current) use of antibiotics: Secondary | ICD-10-CM | POA: Insufficient documentation

## 2014-08-15 DIAGNOSIS — J45909 Unspecified asthma, uncomplicated: Secondary | ICD-10-CM | POA: Insufficient documentation

## 2014-08-15 DIAGNOSIS — Z86010 Personal history of colonic polyps: Secondary | ICD-10-CM | POA: Insufficient documentation

## 2014-08-15 DIAGNOSIS — Z8719 Personal history of other diseases of the digestive system: Secondary | ICD-10-CM | POA: Insufficient documentation

## 2014-08-15 DIAGNOSIS — Z79899 Other long term (current) drug therapy: Secondary | ICD-10-CM | POA: Insufficient documentation

## 2014-08-15 DIAGNOSIS — M25551 Pain in right hip: Secondary | ICD-10-CM | POA: Insufficient documentation

## 2014-08-15 MED ORDER — ONDANSETRON 4 MG PO TBDP
4.0000 mg | ORAL_TABLET | Freq: Once | ORAL | Status: AC
  Administered 2014-08-15: 4 mg via ORAL
  Filled 2014-08-15: qty 1

## 2014-08-15 MED ORDER — OXYCODONE-ACETAMINOPHEN 5-325 MG PO TABS
1.0000 | ORAL_TABLET | Freq: Once | ORAL | Status: AC
  Administered 2014-08-15: 1 via ORAL
  Filled 2014-08-15: qty 1

## 2014-08-15 MED ORDER — PREDNISONE 20 MG PO TABS: ORAL_TABLET | ORAL | Status: DC

## 2014-08-15 MED ORDER — DEXAMETHASONE SODIUM PHOSPHATE 4 MG/ML IJ SOLN
10.0000 mg | Freq: Once | INTRAMUSCULAR | Status: AC
  Administered 2014-08-15: 10 mg via INTRAVENOUS
  Filled 2014-08-15: qty 3

## 2014-08-15 NOTE — ED Provider Notes (Signed)
CSN: [id number]     Arrival date & time 08/15/14  0018 History   First MD Initiated Contact with Patient 08/15/14 0116     Chief Complaint  Patient presents with  . Leg Pain     (Consider location/radiation/quality/duration/timing/severity/associated sxs/prior Treatment) HPI  Patient reports he had a right hip replacement done about a year and a half ago. He reports yesterday morning about 10 AM he was washing his car. He states he was bent over and when he stood up he had acute onset of right lower back pain that radiates into his right leg. He was seen in the ED last night. He states he was given pain medication without relief. He was given a prescription for Percocet which he did not fill today. He states if the medicine he was given in the ED last night did not work he did not think Percocet would. He states today he has less pain in his back and more pain in his hip. He states this morning for a while when he tried to walk he felt like his hip was wobbly for about an hour. He states it hurts when he puts weight on his leg. He also states it hurts when he flexes his hip and knee. The pain is in his lateral right thigh. He states he has taken Advil 4 tablets every 6 hours today for a total of 3 times. He states it has not helped his pain. He denies any urinary or fecal incontinence. He states he's never had pain like this before.  PCP Dr Willey Blade Orthopedist Dr Alvan Dame  Past Medical History  Diagnosis Date  . Pneumonia     December 2012  . Asthma   . Colon polyps   . Constipation   . GERD (gastroesophageal reflux disease)     occasional  . Arthritis   . Psoriasis   . Diverticulitis   . Pneumothorax    Past Surgical History  Procedure Laterality Date  . Right hip surgery  1993    Secondary to a staph infection  . Hernia repair  1993  . Colonoscopy  07/29/2011    Procedure: COLONOSCOPY;  Surgeon: Rogene Houston, MD;  Location: [facility];  Service: Endoscopy;  Laterality: N/A;  7:30  am  . Total hip arthroplasty Right 03/14/2013    Procedure: RIGHT TOTAL HIP ARTHROPLASTY ANTERIOR APPROACH;  Surgeon: Mauri Pole, MD;  Location: [facility];  Service: Orthopedics;  Laterality: Right;   Family History  Problem Relation Age of Onset  . Colon cancer Neg Hx   . Diabetes Father   . Heart attack Father    History  Substance Use Topics  . Smoking status: Never Smoker   . Smokeless tobacco: Never Used  . Alcohol Use: No  lives at home  Lives with spouse  Review of Systems  All other systems reviewed and are negative.     Allergies  Codeine  Home Medications   Prior to Admission medications   Medication Sig Start Date End Date Taking? Authorizing Provider  albuterol (PROVENTIL HFA;VENTOLIN HFA) 108 (90 BASE) MCG/ACT inhaler Inhale 2 puffs into the lungs every 6 (six) hours as needed. For shortness of breath     Historical Provider, MD  amoxicillin-clavulanate (AUGMENTIN) 875-125 MG per tablet Take 1 tablet by mouth every 12 (twelve) hours. Patient not taking: Reported on 08/13/2014 05/15/13   Fredia Sorrow, MD  docusate sodium 100 MG CAPS Take 100 mg by mouth 2 (two) times daily. Patient  not taking: Reported on 08/13/2014 03/15/13   Lucille Passy Babish, PA-C  ferrous sulfate 325 (65 FE) MG tablet Take 1 tablet (325 mg total) by mouth 3 (three) times daily after meals. Patient not taking: Reported on 08/13/2014 03/15/13   Lucille Passy Babish, PA-C  Fluticasone-Salmeterol (ADVAIR) 250-50 MCG/DOSE AEPB Inhale 1 puff into the lungs every other day.    Historical Provider, MD  HYDROcodone-acetaminophen (NORCO) 7.5-325 MG per tablet Take 1-2 tablets by mouth every 4 (four) hours as needed. Patient not taking: Reported on 08/13/2014 03/15/13   Lucille Passy Babish, PA-C  methocarbamol (ROBAXIN) 500 MG tablet Take 1 tablet (500 mg total) by mouth every 6 (six) hours as needed. Patient not taking: Reported on 08/13/2014 03/15/13   Lucille Passy Babish, PA-C  naproxen (NAPROSYN) 500 MG tablet  Take 1 tablet (500 mg total) by mouth 2 (two) times daily. 08/13/14   Maudry Diego, MD  ondansetron (ZOFRAN ODT) 4 MG disintegrating tablet 4mg  ODT q4 hours prn nausea/vomit 08/13/14   Maudry Diego, MD  oxyCODONE-acetaminophen (PERCOCET/ROXICET) 5-325 MG per tablet Take 1 tablet by mouth every 6 (six) hours as needed. 08/13/14   Maudry Diego, MD  polyethylene glycol (MIRALAX / GLYCOLAX) packet Take 17 g by mouth 2 (two) times daily. Patient not taking: Reported on 08/13/2014 03/15/13   Lucille Passy Babish, PA-C  traMADol (ULTRAM) 50 MG tablet Take 1 tablet (50 mg total) by mouth every 6 (six) hours as needed for pain. Patient not taking: Reported on 08/13/2014 05/15/13   Fredia Sorrow, MD   BP 145/90 mmHg  Pulse 87  Temp(Src) 97.8 F (36.6 C) (Oral)  Resp 20  Ht 5\' 11"  (1.803 m)  Wt 198 lb (89.812 kg)  BMI 27.63 kg/m2  SpO2 100%  Vital signs normal   Physical Exam  Constitutional: He is oriented to person, place, and time. He appears well-developed and well-nourished.  Non-toxic appearance. He does not appear ill. No distress.  HENT:  Head: Normocephalic and atraumatic.  Right Ear: External ear normal.  Left Ear: External ear normal.  Nose: Nose normal. No mucosal edema or rhinorrhea.  Mouth/Throat: Oropharynx is clear and moist and mucous membranes are normal. No dental abscesses or uvula swelling.  Eyes: Conjunctivae and EOM are normal. Pupils are equal, round, and reactive to light.  Neck: Normal range of motion and full passive range of motion without pain. Neck supple.  Cardiovascular: Normal rate, regular rhythm and normal heart sounds.  Exam reveals no gallop and no friction rub.   No murmur heard. Pulmonary/Chest: Effort normal and breath sounds normal. No respiratory distress. He has no wheezes. He has no rhonchi. He has no rales. He exhibits no tenderness and no crepitus.  Abdominal: Soft. Normal appearance and bowel sounds are normal. He exhibits no distension. There is no  tenderness. There is no rebound and no guarding.  Musculoskeletal: Normal range of motion. He exhibits no edema or tenderness.  Moves all extremities well. Nontender thoracic and lumbar spine. Nontender SI joints bilaterally. Patient's very tender over the greater trochanter of his right hip immediately underneath his well-healed surgical scar. He states it's painful when he does range of motion at the hip, although his range of motion is normal. There is no redness or swelling of the skin.  Neurological: He is alert and oriented to person, place, and time. He has normal strength. No cranial nerve deficit.  Skin: Skin is warm, dry and intact. No rash noted. No erythema. No pallor.  Psychiatric: He has a normal mood and affect. His speech is normal and behavior is normal. His mood appears not anxious.  Nursing note and vitals reviewed.   ED Course  Procedures (including critical care time) Medications  dexamethasone (DECADRON) injection 10 mg (not administered)  oxyCODONE-acetaminophen (PERCOCET/ROXICET) 5-325 MG per tablet 1 tablet (not administered)    Review of patient's ED chart from last night shows he got dilaudid 2 mg and Toradol. He states it did nothing for his pain. Patient is questioning if steroids would help with his pain. After reviewing his x-ray to make sure there was no hardware problem he was started on steroids. Patient has not filled the Percocet he was prescribed last night. Advised to get it filled.    Labs Review Labs Reviewed - No data to display  Imaging Review   Dg Hip Unilat With Pelvis 2-3 Views Right  08/15/2014   CLINICAL DATA:  Acute onset of right hip pain, radiating to the right lower leg. Initial encounter.  EXAM: DG HIP W/ PELVIS 2-3V*R*  COMPARISON:  None.  FINDINGS: There is no evidence of fracture or dislocation. The patient's total right hip arthroplasty is grossly unremarkable in appearance. There is no definite evidence of loosening. There is  suggestion of mild chronic flattening of the left femoral head, with minimal associated sclerotic change at the superior acetabulum. The proximal right femur appears intact. No significant degenerative change is appreciated. The sacroiliac joints are unremarkable in appearance.  The visualized bowel gas pattern is grossly unremarkable in appearance. Scattered phleboliths are noted within the pelvis.  IMPRESSION: 1. No evidence of acute fracture or dislocation. 2. Right hip arthroplasty is unremarkable in appearance. 3. Suggestion of mild chronic flattening of the left femoral head, with minimal associated degenerative change.   Electronically Signed   By: Garald Balding M.D.   On: 08/15/2014 02:30     Ct Renal Stone Study  08/13/2014   CLINICAL DATA:  Initial encounter for right flank pain beginning this morning.  EXAM: CT ABDOMEN AND PELVIS WITHOUT CONTRAST  TECHNIQUE: Multidetector CT imaging of the abdomen and pelvis was performed following the standard protocol without IV contrast.  COMPARISON:  05/15/2013  FINDINGS: Lower chest:  Subsegmental atelectasis seen in the right lung base.  Hepatobiliary: Insert normal uninfused the liver numerous stones are seen in the lumen of the gallbladder. No pericholecystic fluid. No intrahepatic or extrahepatic biliary dilation.  Pancreas: No focal mass lesion. No dilatation of the main duct. No intraparenchymal cyst. No peripancreatic edema.  Spleen: No splenomegaly. No focal mass lesion.  Adrenals/Urinary Tract: No adrenal nodule or mass. No stones in either kidney. No secondary changes in either kidney. No ureteral stones or evidence of hydroureter. There is some streak artifact in the inferior pelvis emanating from the right hip replacement, but no stones are visible in the bladder.  Stomach/Bowel: Stomach is nondistended. No gastric wall thickening. No evidence of outlet obstruction. Duodenum is normally positioned as is the ligament of Treitz. No small bowel wall  thickening. No small bowel dilatation. Terminal ileum is normal. The appendix is normal. Diverticuli are seen scattered along the entire length of the colon without CT findings of diverticulitis.  Vascular/Lymphatic: Atherosclerotic calcification is noted in the wall of the abdominal aorta without aneurysm. No lymphadenopathy in the abdomen. There are some small lymph nodes in the central small bowel mesentery with some associated haziness of the surrounding fat. This is a nonspecific finding and is unchanged in the interval. No  pelvic sidewall lymphadenopathy.  Reproductive: Prostate gland and seminal vesicles are unremarkable.  Other: No intraperitoneal free fluid.  Musculoskeletal: Patient is status post right hip replacement. Bone windows reveal no worrisome lytic or sclerotic osseous lesions.  IMPRESSION: 1. No findings on today's study to explain the patient's history of right flank pain. 2. Interval resolution of left-sided diverticulitis seen previously. No evidence for acute diverticulitis on the current study. 3. Cholelithiasis 4. Atherosclerosis.   Electronically Signed   By: Misty Stanley M.D.   On: 08/13/2014 19:00     EKG Interpretation None      MDM   Final diagnoses:  Pain, hip, right    New Prescriptions   PREDNISONE (DELTASONE) 20 MG TABLET    Take 3 po QD x 3d , then 2 po QD x 3d then 1 po QD x 3d    Plan discharge   Rolland Porter, MD, Abram Sander     Janice Norrie, MD 08/15/14 [phone]

## 2014-08-15 NOTE — ED Notes (Signed)
Pt states he was seen here in the e.d. Last night for right leg. Pt states he was given rx for pain and nausea but did not have time to fill it today due to a death in the family

## 2014-08-15 NOTE — Discharge Instructions (Signed)
Get the medications filled you were given yesterday, except for the naproxen. Take the prednisone as directed. Follow up with Dr Aurea Graff office if you aren't improving in the next week.

## 2016-07-08 ENCOUNTER — Encounter (INDEPENDENT_AMBULATORY_CARE_PROVIDER_SITE_OTHER): Payer: Self-pay | Admitting: *Deleted

## 2016-08-14 ENCOUNTER — Observation Stay (HOSPITAL_COMMUNITY)
Admission: EM | Admit: 2016-08-14 | Discharge: 2016-08-15 | Disposition: A | Discharge status: Home / Self Care | Payer: Self-pay | Attending: Internal Medicine | Admitting: Internal Medicine

## 2016-08-14 ENCOUNTER — Encounter (HOSPITAL_COMMUNITY): Payer: Self-pay | Admitting: Emergency Medicine

## 2016-08-14 ENCOUNTER — Emergency Department (HOSPITAL_COMMUNITY): Payer: Self-pay

## 2016-08-14 DIAGNOSIS — Z8719 Personal history of other diseases of the digestive system: Secondary | ICD-10-CM | POA: Insufficient documentation

## 2016-08-14 DIAGNOSIS — Z23 Encounter for immunization: Secondary | ICD-10-CM | POA: Insufficient documentation

## 2016-08-14 DIAGNOSIS — Z96641 Presence of right artificial hip joint: Secondary | ICD-10-CM | POA: Insufficient documentation

## 2016-08-14 DIAGNOSIS — Z885 Allergy status to narcotic agent status: Secondary | ICD-10-CM | POA: Insufficient documentation

## 2016-08-14 DIAGNOSIS — J45909 Unspecified asthma, uncomplicated: Secondary | ICD-10-CM | POA: Diagnosis present

## 2016-08-14 DIAGNOSIS — J4541 Moderate persistent asthma with (acute) exacerbation: Principal | ICD-10-CM | POA: Insufficient documentation

## 2016-08-14 DIAGNOSIS — L409 Psoriasis, unspecified: Secondary | ICD-10-CM | POA: Insufficient documentation

## 2016-08-14 DIAGNOSIS — K219 Gastro-esophageal reflux disease without esophagitis: Secondary | ICD-10-CM | POA: Insufficient documentation

## 2016-08-14 DIAGNOSIS — E876 Hypokalemia: Secondary | ICD-10-CM | POA: Insufficient documentation

## 2016-08-14 DIAGNOSIS — Z86010 Personal history of colonic polyps: Secondary | ICD-10-CM | POA: Insufficient documentation

## 2016-08-14 DIAGNOSIS — J45901 Unspecified asthma with (acute) exacerbation: Secondary | ICD-10-CM | POA: Diagnosis present

## 2016-08-14 LAB — CBC WITH DIFFERENTIAL/PLATELET
BASOS PCT: 1 %
Basophils Absolute: 0 10*3/uL (ref 0.0–0.1)
Eosinophils Absolute: 0.3 10*3/uL (ref 0.0–0.7)
Eosinophils Relative: 4 %
HEMATOCRIT: 44.7 % (ref 39.0–52.0)
HEMOGLOBIN: 15.2 g/dL (ref 13.0–17.0)
LYMPHS PCT: 18 %
Lymphs Abs: 1.4 10*3/uL (ref 0.7–4.0)
MCH: 30.3 pg (ref 26.0–34.0)
MCHC: 34 g/dL (ref 30.0–36.0)
MCV: 89.2 fL (ref 78.0–100.0)
Monocytes Absolute: 0.5 10*3/uL (ref 0.1–1.0)
Monocytes Relative: 6 %
Neutro Abs: 5.8 10*3/uL (ref 1.7–7.7)
Neutrophils Relative %: 71 %
Platelets: 194 10*3/uL (ref 150–400)
RBC: 5.01 MIL/uL (ref 4.22–5.81)
RDW: 13.3 % (ref 11.5–15.5)
WBC: 8 10*3/uL (ref 4.0–10.5)

## 2016-08-14 LAB — BASIC METABOLIC PANEL
Anion gap: 9 (ref 5–15)
BUN: 11 mg/dL (ref 6–20)
CALCIUM: 9.1 mg/dL (ref 8.9–10.3)
CO2: 28 mmol/L (ref 22–32)
Chloride: 104 mmol/L (ref 101–111)
Creatinine, Ser: 0.86 mg/dL (ref 0.61–1.24)
GFR calc Af Amer: >60 mL/min (ref >60)
GLUCOSE: 90 mg/dL (ref 65–99)
Potassium: 3.4 mmol/L — ABNORMAL LOW (ref 3.5–5.1)
Sodium: 141 mmol/L (ref 135–145)

## 2016-08-14 LAB — MRSA PCR SCREENING: MRSA by PCR: NEGATIVE

## 2016-08-14 MED ORDER — ALBUTEROL SULFATE (2.5 MG/3ML) 0.083% IN NEBU
5.0000 mg | INHALATION_SOLUTION | Freq: Once | RESPIRATORY_TRACT | Status: AC
  Administered 2016-08-14: 5 mg via RESPIRATORY_TRACT
  Filled 2016-08-14: qty 6

## 2016-08-14 MED ORDER — IPRATROPIUM-ALBUTEROL 0.5-2.5 (3) MG/3ML IN SOLN
3.0000 mL | Freq: Four times a day (QID) | RESPIRATORY_TRACT | Status: DC
  Administered 2016-08-14 – 2016-08-15 (×3): 3 mL via RESPIRATORY_TRACT
  Filled 2016-08-14 (×3): qty 3

## 2016-08-14 MED ORDER — PREDNISONE 10 MG PO TABS
60.0000 mg | ORAL_TABLET | Freq: Once | ORAL | Status: AC
  Administered 2016-08-14: 60 mg via ORAL
  Filled 2016-08-14: qty 1

## 2016-08-14 MED ORDER — DOXYCYCLINE HYCLATE 100 MG PO TABS
100.0000 mg | ORAL_TABLET | Freq: Two times a day (BID) | ORAL | Status: DC
  Administered 2016-08-14 – 2016-08-15 (×2): 100 mg via ORAL
  Filled 2016-08-14 (×2): qty 1

## 2016-08-14 MED ORDER — POTASSIUM CHLORIDE CRYS ER 20 MEQ PO TBCR
40.0000 meq | EXTENDED_RELEASE_TABLET | Freq: Once | ORAL | Status: AC
  Administered 2016-08-14: 40 meq via ORAL
  Filled 2016-08-14: qty 2

## 2016-08-14 MED ORDER — ALBUTEROL SULFATE (2.5 MG/3ML) 0.083% IN NEBU: 2.5000 mg | INHALATION_SOLUTION | RESPIRATORY_TRACT | Status: DC | PRN

## 2016-08-14 MED ORDER — PREDNISONE 20 MG PO TABS
60.0000 mg | ORAL_TABLET | Freq: Every day | ORAL | Status: DC
  Administered 2016-08-15: 60 mg via ORAL
  Filled 2016-08-14: qty 3

## 2016-08-14 MED ORDER — IPRATROPIUM BROMIDE 0.02 % IN SOLN
0.5000 mg | Freq: Once | RESPIRATORY_TRACT | Status: AC
  Administered 2016-08-14: 0.5 mg via RESPIRATORY_TRACT
  Filled 2016-08-14: qty 2.5

## 2016-08-14 MED ORDER — INFLUENZA VAC SPLIT QUAD 0.5 ML IM SUSY
0.5000 mL | PREFILLED_SYRINGE | INTRAMUSCULAR | Status: AC
  Administered 2016-08-15: 0.5 mL via INTRAMUSCULAR
  Filled 2016-08-14: qty 0.5

## 2016-08-14 MED ORDER — ENOXAPARIN SODIUM 40 MG/0.4ML ~~LOC~~ SOLN
40.0000 mg | SUBCUTANEOUS | Status: DC
  Administered 2016-08-14: 40 mg via SUBCUTANEOUS
  Filled 2016-08-14: qty 0.4

## 2016-08-14 MED ORDER — IPRATROPIUM-ALBUTEROL 0.5-2.5 (3) MG/3ML IN SOLN
3.0000 mL | Freq: Once | RESPIRATORY_TRACT | Status: AC
  Administered 2016-08-14: 3 mL via RESPIRATORY_TRACT
  Filled 2016-08-14: qty 3

## 2016-08-14 MED ORDER — ALBUTEROL (5 MG/ML) CONTINUOUS INHALATION SOLN
10.0000 mg/h | INHALATION_SOLUTION | Freq: Once | RESPIRATORY_TRACT | Status: AC
  Administered 2016-08-14: 10 mg/h via RESPIRATORY_TRACT
  Filled 2016-08-14: qty 20

## 2016-08-14 MED ORDER — MAGNESIUM SULFATE 2 GM/50ML IV SOLN
2.0000 g | Freq: Once | INTRAVENOUS | Status: AC
  Administered 2016-08-14: 2 g via INTRAVENOUS
  Filled 2016-08-14: qty 50

## 2016-08-14 MED ORDER — SODIUM CHLORIDE 0.9 % IV BOLUS (SEPSIS)
500.0000 mL | Freq: Once | INTRAVENOUS | Status: AC
  Administered 2016-08-14: 500 mL via INTRAVENOUS

## 2016-08-14 MED ORDER — MOMETASONE FURO-FORMOTEROL FUM 200-5 MCG/ACT IN AERO
2.0000 | INHALATION_SPRAY | Freq: Two times a day (BID) | RESPIRATORY_TRACT | Status: DC
  Administered 2016-08-14 – 2016-08-15 (×2): 2 via RESPIRATORY_TRACT
  Filled 2016-08-14: qty 8.8

## 2016-08-14 NOTE — ED Provider Notes (Signed)
[facility] DEPT Provider Note   CSN: [id number] Arrival date & time: 08/14/16  0808     History   Chief Complaint Chief Complaint  Patient presents with  . Shortness of Breath    HPI Jeffrey Mcmahon is a 66 y.o. male with a past medical history of asthma presenting with persistent cough, sob and wheezing for the past week.  He and his wife traveled to the beach for the week after Christmas and while there, he developed increased wheezing and nonproductive cough.  He called his pcp who called in a 7 day course of doxycycline and a 4 day prednisone taper which he states was helping until he finished these medicines.  He has been taking his neb treatments q 2 hours since last night without significant improvement in the wheezing.  His cough has been productive of light yellow sputum, non bloody.  He denies fevers, chills, chest pain, diaphoresis, nausea and vomiting.  He endorses having similar symptoms several years ago when he had to be admitted for pneumonia.    The history is provided by the patient and the spouse.    Past Medical History:  Diagnosis Date  . Arthritis   . Asthma   . Colon polyps   . Constipation   . Diverticulitis   . GERD (gastroesophageal reflux disease)    occasional  . Pneumonia    December 2012  . Pneumothorax   . Psoriasis     Patient Active Problem List   Diagnosis Date Noted  . Overweight (BMI 25.0-29.9) 03/15/2013  . S/P right THA, AA 03/14/2013    Past Surgical History:  Procedure Laterality Date  . COLONOSCOPY  07/29/2011   Procedure: COLONOSCOPY;  Surgeon: Rogene Houston, MD;  Location: [facility];  Service: Endoscopy;  Laterality: N/A;  7:30 am  . HERNIA REPAIR  1993  . Right hip surgery  1993   Secondary to a staph infection  . TOTAL HIP ARTHROPLASTY Right 03/14/2013   Procedure: RIGHT TOTAL HIP ARTHROPLASTY ANTERIOR APPROACH;  Surgeon: Mauri Pole, MD;  Location: [facility];  Service: Orthopedics;  Laterality: Right;        Home Medications    Prior to Admission medications   Medication Sig Start Date End Date Taking? Authorizing Provider  albuterol (PROVENTIL HFA;VENTOLIN HFA) 108 (90 BASE) MCG/ACT inhaler Inhale 2 puffs into the lungs every 6 (six) hours as needed. For shortness of breath    Yes Historical Provider, MD  Fluticasone-Salmeterol (ADVAIR) 250-50 MCG/DOSE AEPB Inhale 1 puff into the lungs every other day.   Yes Historical Provider, MD  ibuprofen (ADVIL,MOTRIN) 200 MG tablet Take 400-800 mg by mouth every 6 (six) hours as needed for headache.   Yes Historical Provider, MD  predniSONE (DELTASONE) 20 MG tablet Take 3 po QD x 3d , then 2 po QD x 3d then 1 po QD x 3d Patient not taking: Reported on 08/14/2016 08/15/14   Rolland Porter, MD    Family History Family History  Problem Relation Age of Onset  . Diabetes Father   . Heart attack Father   . Colon cancer Neg Hx     Social History Social History  Substance Use Topics  . Smoking status: Never Smoker  . Smokeless tobacco: Never Used  . Alcohol use No     Allergies   Codeine   Review of Systems Review of Systems  Constitutional: Negative for diaphoresis and fever.  HENT: Negative for congestion and sore throat.   Eyes:  Negative.   Respiratory: Positive for chest tightness, shortness of breath and wheezing.   Cardiovascular: Negative for chest pain and leg swelling.  Gastrointestinal: Negative for abdominal pain, nausea and vomiting.  Genitourinary: Negative.   Musculoskeletal: Negative for arthralgias, joint swelling and neck pain.  Skin: Negative.  Negative for rash and wound.  Neurological: Negative for dizziness, weakness, light-headedness, numbness and headaches.  Psychiatric/Behavioral: Negative.      Physical Exam Updated Vital Signs BP 138/71 (BP Location: Left Arm)   Pulse (S) (!) 121 Comment: HR between 117-121 while ambulating  Temp 97.5 F (36.4 C) (Oral)   Resp 24   Ht 5\' 11"  (1.803 m)   Wt 93 kg   SpO2  93%   BMI 28.59 kg/m   Physical Exam  Constitutional: He appears well-developed and well-nourished.  HENT:  Head: Normocephalic and atraumatic.  Eyes: Conjunctivae are normal.  Neck: Normal range of motion.  Cardiovascular: Normal rate, regular rhythm, normal heart sounds and intact distal pulses.   Pulmonary/Chest: No respiratory distress. He has decreased breath sounds. He has wheezes in the right upper field and the left upper field. He has no rhonchi. He has no rales. He exhibits no tenderness.  Expiratory wheeze with prolonged expirations.  Abdominal: Soft. Bowel sounds are normal. There is no tenderness.  Musculoskeletal: Normal range of motion.  Neurological: He is alert.  Skin: Skin is warm and dry.  Psychiatric: He has a normal mood and affect.  Nursing note and vitals reviewed.    ED Treatments / Results  Labs (all labs ordered are listed, but only abnormal results are displayed) Labs Reviewed  BASIC METABOLIC PANEL - Abnormal; Notable for the following:       Result Value   Potassium 3.4 (*)    All other components within normal limits  CBC WITH DIFFERENTIAL/PLATELET    EKG  EKG Interpretation  Date/Time:  Friday August 14 2016 08:17:23 EST Ventricular Rate:  95 PR Interval:    QRS Duration: 94 QT Interval:  349 QTC Calculation: 439 R Axis:   69 Text Interpretation:  Sinus rhythm No significant change since last tracing Confirmed by [facility] MD, JASON [phone]) on 08/14/2016 9:06:52 AM       Radiology Dg Chest 2 View  Result Date: 08/14/2016 CLINICAL DATA:  Short of breath for 1 week EXAM: CHEST  2 VIEW COMPARISON:  02/24/2013 FINDINGS: Normal heart size. Lungs clear. No pneumothorax. No pleural effusion. IMPRESSION: No active cardiopulmonary disease. Electronically Signed   By: Marybelle Killings M.D.   On: 08/14/2016 09:19    Procedures Procedures (including critical care time)  Medications Ordered in ED Medications  magnesium sulfate IVPB 2 g 50 mL (2 g  Intravenous New Bag/Given 08/14/16 1300)  albuterol (PROVENTIL) (2.5 MG/3ML) 0.083% nebulizer solution 5 mg (5 mg Nebulization Given 08/14/16 0833)  predniSONE (DELTASONE) tablet 60 mg (60 mg Oral Given 08/14/16 0855)  albuterol (PROVENTIL,VENTOLIN) solution continuous neb (10 mg/hr Nebulization Given 08/14/16 0946)  ipratropium (ATROVENT) nebulizer solution 0.5 mg (0.5 mg Nebulization Given 08/14/16 0946)  sodium chloride 0.9 % bolus 500 mL (500 mLs Intravenous New Bag/Given 08/14/16 1257)  ipratropium-albuterol (DUONEB) 0.5-2.5 (3) MG/3ML nebulizer solution 3 mL (3 mLs Nebulization Given 08/14/16 1321)     Initial Impression / Assessment and Plan / ED Course  I have reviewed the triage vital signs and the nursing notes.  Pertinent labs & imaging results that were available during my care of the patient were reviewed by me and considered in  my medical decision making (see chart for details).  Clinical Course     Pt was given one albuterol/atrovent neb tx with a small amount of perceived improvement per pt, really no change on lung exam.  Prednisone 60 mg PO given.  Albuterol hour long neb ordered.  Pt ambulated after 2nd albuterol neb given.  Sats 91-93%.  Increased cough and dyspnea with ambulation.  Pt would benefit from admission/overnight observation for further neb tx. Labs ordered.  Will call for admission once labs obtained.  Final Clinical Impressions(s) / ED Diagnoses   Final diagnoses:  Moderate persistent asthma with exacerbation    New Prescriptions New Prescriptions   No medications on file     Evalee Jefferson, Hershal Coria 08/14/16 [place], MD 08/17/16 2145

## 2016-08-14 NOTE — ED Provider Notes (Signed)
Medical screening examination/treatment/procedure(s) were conducted as a shared visit with non-physician practitioner(s) and myself.  I personally evaluated the patient during the encounter.  On my exam, patient has started had an 50 mg of albuterol, ipratropium and steroids. He's been in the emergency department for 4 hours. He is persistently tachypneic, tachycardic with oxygen in the low 90s. He just got him walking where he got more short of breath and dizzy as well. His lung exam for me still has diffuse wheezing and decreased breath sound. No crackles. No asymmetric lung sounds. The patient is likely having a recent asthma exacerbation, we tried everything to get him improved however he has not. Plan on labs and admit for observation to make sure this is improving over the next 24-48 hours.    EKG Interpretation  Date/Time:  Friday August 14 2016 08:17:23 EST Ventricular Rate:  95 PR Interval:    QRS Duration: 94 QT Interval:  349 QTC Calculation: 439 R Axis:   69 Text Interpretation:  Sinus rhythm No significant change since last tracing Confirmed by [facility] MD, Bayden Gil [phone]) on 08/14/2016 9:06:52 AM         Merrily Pew, MD 08/14/16 1551

## 2016-08-14 NOTE — H&P (Signed)
History and Physical    Jeffrey Mcmahon Jeffrey Mcmahon DOB: February 06, 1951 DOA: 08/14/2016  PCP: Asencion Noble, MD  Patient coming from: [place].   Chief Complaint: sob since one week.   HPI: Jeffrey Mcmahon is a 66 y.o. male with medical history significant of asthma, GERD, admitted for sob associated with dry cough. He was recently treated with prednisone taper , but he reports his breathing hasn't improved. He was referred to medical service for admission.   Review of Systems: As per HPI otherwise 10 point review of systems negative.    Past Medical History:  Diagnosis Date  . Arthritis   . Asthma   . Colon polyps   . Constipation   . Diverticulitis   . GERD (gastroesophageal reflux disease)    occasional  . Pneumonia    December 2012  . Pneumothorax   . Psoriasis     Past Surgical History:  Procedure Laterality Date  . COLONOSCOPY  07/29/2011   Procedure: COLONOSCOPY;  Surgeon: Rogene Houston, MD;  Location: [facility];  Service: Endoscopy;  Laterality: N/A;  7:30 am  . HERNIA REPAIR  1993  . Right hip surgery  1993   Secondary to a staph infection  . TOTAL HIP ARTHROPLASTY Right 03/14/2013   Procedure: RIGHT TOTAL HIP ARTHROPLASTY ANTERIOR APPROACH;  Surgeon: Mauri Pole, MD;  Location: [facility];  Service: Orthopedics;  Laterality: Right;     reports that he has never smoked. He has never used smokeless tobacco. He reports that he does not drink alcohol or use drugs.  Allergies  Allergen Reactions  . Codeine Nausea Only    Family History  Problem Relation Age of Onset  . Diabetes Father   . Heart attack Father   . Colon cancer Neg Hx   family history reviewed.   Prior to Admission medications   Medication Sig Start Date End Date Taking? Authorizing Provider  albuterol (PROVENTIL HFA;VENTOLIN HFA) 108 (90 BASE) MCG/ACT inhaler Inhale 2 puffs into the lungs every 6 (six) hours as needed. For shortness of breath    Yes Historical Provider, MD  Fluticasone-Salmeterol  (ADVAIR) 250-50 MCG/DOSE AEPB Inhale 1 puff into the lungs every other day.   Yes Historical Provider, MD  ibuprofen (ADVIL,MOTRIN) 200 MG tablet Take 400-800 mg by mouth every 6 (six) hours as needed for headache.   Yes Historical Provider, MD  predniSONE (DELTASONE) 20 MG tablet Take 3 po QD x 3d , then 2 po QD x 3d then 1 po QD x 3d Patient not taking: Reported on 08/14/2016 08/15/14   Rolland Porter, MD    Physical Exam: Vitals:   08/14/16 1321 08/14/16 1344 08/14/16 1345 08/14/16 1442  BP:   146/79 (!) 167/89  Pulse:  119  (!) 110  Resp:  26    Temp:    97.5 F (36.4 C)  TempSrc:    Oral  SpO2: 93% 96%  96%  Weight:    94.3 kg (208 lb)  Height:    5\' 11"  (1.803 m)      Constitutional: NAD, calm, comfortable Vitals:   08/14/16 1321 08/14/16 1344 08/14/16 1345 08/14/16 1442  BP:   146/79 (!) 167/89  Pulse:  119  (!) 110  Resp:  26    Temp:    97.5 F (36.4 C)  TempSrc:    Oral  SpO2: 93% 96%  96%  Weight:    94.3 kg (208 lb)  Height:    5\' 11"  (1.803 m)  Eyes: PERRL, lids and conjunctivae normal ENMT: Mucous membranes are moist. Posterior pharynx clear of any exudate or lesions.Normal dentition.  Neck: normal, supple, no masses, no thyromegaly Respiratory: bilateral scattered wheezing.  Cardiovascular: Regular rate and rhythm, no murmurs / rubs / gallops. No extremity edema. 2+ pedal pulses. No carotid bruits.  Abdomen: no tenderness, no masses palpated. No hepatosplenomegaly. Bowel sounds positive.  Musculoskeletal: no clubbing / cyanosis. No joint deformity upper and lower extremities. Good ROM, no contractures. Normal muscle tone.  Skin: no rashes, lesions, ulcers. No induration Neurologic: CN 2-12 grossly intact. Sensation intact, DTR normal. Strength 5/5 in all 4.  Psychiatric: Normal judgment and insight. Alert and oriented x 3. Normal mood.    Labs on Admission: I have personally reviewed following labs and imaging studies  CBC:  Recent Labs Lab 08/14/16 1130    WBC 8.0  NEUTROABS 5.8  HGB 15.2  HCT 44.7  MCV 89.2  PLT [id number]   Basic Metabolic Panel:  Recent Labs Lab 08/14/16 1130  NA 141  K 3.4*  CL 104  CO2 28  GLUCOSE 90  BUN 11  CREATININE 0.86  CALCIUM 9.1   GFR: Estimated Creatinine Clearance: 101.8 mL/min (by C-G formula based on SCr of 0.86 mg/dL). Liver Function Tests: No results for input(s): AST, ALT, ALKPHOS, BILITOT, PROT, ALBUMIN in the last 168 hours. No results for input(s): LIPASE, AMYLASE in the last 168 hours. No results for input(s): AMMONIA in the last 168 hours. Coagulation Profile: No results for input(s): INR, PROTIME in the last 168 hours. Cardiac Enzymes: No results for input(s): CKTOTAL, CKMB, CKMBINDEX, TROPONINI in the last 168 hours. BNP (last 3 results) No results for input(s): PROBNP in the last 8760 hours. HbA1C: No results for input(s): HGBA1C in the last 72 hours. CBG: No results for input(s): GLUCAP in the last 168 hours. Lipid Profile: No results for input(s): CHOL, HDL, LDLCALC, TRIG, CHOLHDL, LDLDIRECT in the last 72 hours. Thyroid Function Tests: No results for input(s): TSH, T4TOTAL, FREET4, T3FREE, THYROIDAB in the last 72 hours. Anemia Panel: No results for input(s): VITAMINB12, FOLATE, FERRITIN, TIBC, IRON, RETICCTPCT in the last 72 hours. Urine analysis:    Component Value Date/Time   COLORURINE YELLOW 08/13/2014 [place] 08/13/2014 1608   LABSPEC 1.020 08/13/2014 1608   PHURINE 6.5 08/13/2014 1608   GLUCOSEU NEGATIVE 08/13/2014 1608   HGBUR NEGATIVE 08/13/2014 1608   BILIRUBINUR NEGATIVE 08/13/2014 1608   KETONESUR NEGATIVE 08/13/2014 1608   PROTEINUR NEGATIVE 08/13/2014 1608   UROBILINOGEN 0.2 08/13/2014 1608   NITRITE NEGATIVE 08/13/2014 1608   LEUKOCYTESUR NEGATIVE 08/13/2014 1608   Sepsis Labs: !!!!!!!!!!!!!!!!!!!!!!!!!!!!!!!!!!!!!!!!!!!! @LABRCNTIP (procalcitonin:4,lacticidven:4) )No results found for this or any previous visit (from the past 240  hour(s)).   Radiological Exams on Admission: Dg Chest 2 View  Result Date: 08/14/2016 CLINICAL DATA:  Short of breath for 1 week EXAM: CHEST  2 VIEW COMPARISON:  02/24/2013 FINDINGS: Normal heart size. Lungs clear. No pneumothorax. No pleural effusion. IMPRESSION: No active cardiopulmonary disease. Electronically Signed   By: Marybelle Killings M.D.   On: 08/14/2016 09:19    EKG: Independently reviewed. Sinus rhythm  Assessment/Plan Active Problems:   Asthma   Asthma exacerbation acute asthma exacerbation: Started him on duo nebs, prednisone and resume dulera.  Monitor overnight, if breathing improves, plan for d.c home in am.    Hypokalemia: repleted.   Psoriasis: stable.    DVT prophylaxis: lovenox.  Code Status: full code.  Family Communication: famly friends at  bedside.  Disposition Plan: pending further eval, possibly home in am.  Consults called: none.  Admission status: observation/   Demecia Northway MD [facility] Pager [phone]   If 7PM-7AM, please contact night-coverage [email or web address] Password [email or web address]  08/14/2016, 4:31 PM

## 2016-08-14 NOTE — ED Triage Notes (Signed)
Patient complains of shortness of breath that started last night. States dry cough. Patient labored breathing at rest in triage.

## 2016-08-15 MED ORDER — DOXYCYCLINE HYCLATE 100 MG PO TABS: 100.0000 mg | ORAL_TABLET | Freq: Two times a day (BID) | ORAL | 0 refills | Status: DC

## 2016-08-15 MED ORDER — PREDNISONE 20 MG PO TABS: ORAL_TABLET | ORAL | 0 refills | Status: DC

## 2016-08-15 MED ORDER — MOMETASONE FURO-FORMOTEROL FUM 200-5 MCG/ACT IN AERO: 2.0000 | INHALATION_SPRAY | Freq: Two times a day (BID) | RESPIRATORY_TRACT | 0 refills | Status: DC

## 2016-08-15 NOTE — Discharge Summary (Signed)
Physician Discharge Summary  Jeffrey Mcmahon [id number] DOB: 1950-10-15 DOA: 08/14/2016  PCP: Asencion Noble, MD  Admit date: 08/14/2016 Discharge date: 08/15/2016  Admitted From: Home Disposition:  Home   Recommendations for Outpatient Follow-up:  1. Follow up with PCP in 1-2 weeks 2. Please obtain BMP/CBC in one week   Discharge Condition:stable.  CODE STATUS: full code.  Diet recommendation: Heart Healthy  Brief/Interim Summary: Jeffrey Mcmahon is a 66 y.o. male with medical history significant of asthma, GERD, admitted for sob associated with dry cough. He was recently treated with prednisone taper , but he reports his breathing hasn't improved. He was referred to medical service for admission.   Discharge Diagnoses:  Active Problems:   Asthma   Asthma exacerbation   Acute asthma exacerbation: Started him on duo nebs, prednisone and resume dulera.  His breathing and wheezing improved. Discharged on steroid taper.    Hypokalemia: repleted.   Psoriasis: stable.   Discharge Instructions  Discharge Instructions    Discharge instructions    Complete by:  As directed    Please follow up with PCP in one week.     Allergies as of 08/15/2016      Reactions   Codeine Nausea Only      Medication List    STOP taking these medications   Fluticasone-Salmeterol 250-50 MCG/DOSE Aepb Commonly known as:  ADVAIR Replaced by:  mometasone-formoterol 200-5 MCG/ACT Aero   ibuprofen 200 MG tablet Commonly known as:  ADVIL,MOTRIN     TAKE these medications   albuterol 108 (90 Base) MCG/ACT inhaler Commonly known as:  PROVENTIL HFA;VENTOLIN HFA Inhale 2 puffs into the lungs every 6 (six) hours as needed. For shortness of breath   doxycycline 100 MG tablet Commonly known as:  VIBRA-TABS Take 1 tablet (100 mg total) by mouth every 12 (twelve) hours.   mometasone-formoterol 200-5 MCG/ACT Aero Commonly known as:  DULERA Inhale 2 puffs into the lungs 2 (two) times daily. Replaces:   Fluticasone-Salmeterol 250-50 MCG/DOSE Aepb   predniSONE 20 MG tablet Commonly known as:  DELTASONE Prednisone 40 mg daily for 5 days and stop Start taking on:  08/16/2016 What changed:  additional instructions      Follow-up Information    FAGAN,ROY, MD. Schedule an appointment as soon as possible for a visit in 1 week(s).   Specialty:  Internal Medicine Contact information: [address] [place] [address] [phone]          Allergies  Allergen Reactions  . Codeine Nausea Only    Consultations:  None.    Procedures/Studies: Dg Chest 2 View  Result Date: 08/14/2016 CLINICAL DATA:  Short of breath for 1 week EXAM: CHEST  2 VIEW COMPARISON:  02/24/2013 FINDINGS: Normal heart size. Lungs clear. No pneumothorax. No pleural effusion. IMPRESSION: No active cardiopulmonary disease. Electronically Signed   By: Marybelle Killings M.D.   On: 08/14/2016 09:19       Subjective: No complaints.   Discharge Exam: Vitals:   08/14/16 2157 08/15/16 0613  BP: 121/66 126/71  Pulse: (!) 110 81  Resp: 17 16  Temp: 98.2 F (36.8 C) 97.7 F (36.5 C)   Vitals:   08/15/16 0113 08/15/16 0613 08/15/16 0739 08/15/16 0746  BP:  126/71    Pulse:  81    Resp:  16    Temp:  97.7 F (36.5 C)    TempSrc:  Oral    SpO2: 98% 97% 94% 94%  Weight:      Height:  General: Pt is alert, awake, not in acute distress Cardiovascular: RRR, S1/S2 +, no rubs, no gallops Respiratory: CTA bilaterally, no wheezing, no rhonchi Abdominal: Soft, NT, ND, bowel sounds + Extremities: no edema, no cyanosis    The results of significant diagnostics from this hospitalization (including imaging, microbiology, ancillary and laboratory) are listed below for reference.     Microbiology: Recent Results (from the past 240 hour(s))  MRSA PCR Screening     Status: None   Collection Time: 08/14/16  3:24 PM  Result Value Ref Range Status   MRSA by PCR NEGATIVE NEGATIVE Final    Comment:         The GeneXpert MRSA Assay (FDA approved for NASAL specimens only), is one component of a comprehensive MRSA colonization surveillance program. It is not intended to diagnose MRSA infection nor to guide or monitor treatment for MRSA infections.      Labs: BNP (last 3 results) No results for input(s): BNP in the last 8760 hours. Basic Metabolic Panel:  Recent Labs Lab 08/14/16 1130  NA 141  K 3.4*  CL 104  CO2 28  GLUCOSE 90  BUN 11  CREATININE 0.86  CALCIUM 9.1   Liver Function Tests: No results for input(s): AST, ALT, ALKPHOS, BILITOT, PROT, ALBUMIN in the last 168 hours. No results for input(s): LIPASE, AMYLASE in the last 168 hours. No results for input(s): AMMONIA in the last 168 hours. CBC:  Recent Labs Lab 08/14/16 1130  WBC 8.0  NEUTROABS 5.8  HGB 15.2  HCT 44.7  MCV 89.2  PLT 194   Cardiac Enzymes: No results for input(s): CKTOTAL, CKMB, CKMBINDEX, TROPONINI in the last 168 hours. BNP: Invalid input(s): POCBNP CBG: No results for input(s): GLUCAP in the last 168 hours. D-Dimer No results for input(s): DDIMER in the last 72 hours. Hgb A1c No results for input(s): HGBA1C in the last 72 hours. Lipid Profile No results for input(s): CHOL, HDL, LDLCALC, TRIG, CHOLHDL, LDLDIRECT in the last 72 hours. Thyroid function studies No results for input(s): TSH, T4TOTAL, T3FREE, THYROIDAB in the last 72 hours.  Invalid input(s): FREET3 Anemia work up No results for input(s): VITAMINB12, FOLATE, FERRITIN, TIBC, IRON, RETICCTPCT in the last 72 hours. Urinalysis    Component Value Date/Time   COLORURINE YELLOW 08/13/2014 1608   APPEARANCEUR CLEAR 08/13/2014 1608   LABSPEC 1.020 08/13/2014 1608   PHURINE 6.5 08/13/2014 1608   GLUCOSEU NEGATIVE 08/13/2014 1608   HGBUR NEGATIVE 08/13/2014 1608   BILIRUBINUR NEGATIVE 08/13/2014 1608   KETONESUR NEGATIVE 08/13/2014 1608   PROTEINUR NEGATIVE 08/13/2014 1608   UROBILINOGEN 0.2 08/13/2014 1608   NITRITE  NEGATIVE 08/13/2014 1608   LEUKOCYTESUR NEGATIVE 08/13/2014 1608   Sepsis Labs Invalid input(s): PROCALCITONIN,  WBC,  LACTICIDVEN Microbiology Recent Results (from the past 240 hour(s))  MRSA PCR Screening     Status: None   Collection Time: 08/14/16  3:24 PM  Result Value Ref Range Status   MRSA by PCR NEGATIVE NEGATIVE Final    Comment:        The GeneXpert MRSA Assay (FDA approved for NASAL specimens only), is one component of a comprehensive MRSA colonization surveillance program. It is not intended to diagnose MRSA infection nor to guide or monitor treatment for MRSA infections.      Time coordinating discharge: Over 30 minutes  SIGNED:   Hosie Poisson, MD  [facility] 08/15/2016, 3:17 PM Pager   If 7PM-7AM, please contact night-coverage [email or web address] Password [email or web address]

## 2016-08-15 NOTE — Progress Notes (Signed)
Reviewed discharge instructions with client and spouse, instructed on all medications including purpose actions and side effect, educated on next doses due, reviewed medication changes and new medications. Instructed on follow up up appointments and reviewed s/s to report. Client and spouse voice understanding, client discharged to home with family in stable condition.

## 2016-09-01 DIAGNOSIS — R221 Localized swelling, mass and lump, neck: Secondary | ICD-10-CM | POA: Insufficient documentation

## 2016-09-01 DIAGNOSIS — H6122 Impacted cerumen, left ear: Secondary | ICD-10-CM | POA: Insufficient documentation

## 2017-03-01 ENCOUNTER — Other Ambulatory Visit (INDEPENDENT_AMBULATORY_CARE_PROVIDER_SITE_OTHER): Payer: Self-pay | Admitting: *Deleted

## 2017-03-01 DIAGNOSIS — Z86010 Personal history of colonic polyps: Secondary | ICD-10-CM | POA: Insufficient documentation

## 2017-03-01 DIAGNOSIS — Z800 Family history of malignant neoplasm of digestive organs: Secondary | ICD-10-CM | POA: Insufficient documentation

## 2017-03-15 DIAGNOSIS — B078 Other viral warts: Secondary | ICD-10-CM | POA: Diagnosis not present

## 2017-03-15 DIAGNOSIS — L400 Psoriasis vulgaris: Secondary | ICD-10-CM | POA: Diagnosis not present

## 2017-03-15 DIAGNOSIS — B355 Tinea imbricata: Secondary | ICD-10-CM | POA: Diagnosis not present

## 2017-03-15 DIAGNOSIS — L821 Other seborrheic keratosis: Secondary | ICD-10-CM | POA: Diagnosis not present

## 2017-04-14 DIAGNOSIS — L570 Actinic keratosis: Secondary | ICD-10-CM | POA: Diagnosis not present

## 2017-04-14 DIAGNOSIS — X32XXXD Exposure to sunlight, subsequent encounter: Secondary | ICD-10-CM | POA: Diagnosis not present

## 2017-04-14 DIAGNOSIS — B351 Tinea unguium: Secondary | ICD-10-CM | POA: Diagnosis not present

## 2017-04-15 ENCOUNTER — Telehealth (INDEPENDENT_AMBULATORY_CARE_PROVIDER_SITE_OTHER): Payer: Self-pay | Admitting: *Deleted

## 2017-04-15 ENCOUNTER — Encounter (INDEPENDENT_AMBULATORY_CARE_PROVIDER_SITE_OTHER): Payer: Self-pay | Admitting: *Deleted

## 2017-04-15 NOTE — Telephone Encounter (Signed)
Patient needs trilyte 

## 2017-04-16 MED ORDER — PEG 3350-KCL-NA BICARB-NACL 420 G PO SOLR: 4000.0000 mL | Freq: Once | ORAL | 0 refills | Status: AC

## 2017-05-07 ENCOUNTER — Telehealth (INDEPENDENT_AMBULATORY_CARE_PROVIDER_SITE_OTHER): Payer: Self-pay | Admitting: *Deleted

## 2017-05-07 NOTE — Telephone Encounter (Signed)
Referring MD/PCP: fagan   Procedure: tcs  Reason/Indication:  Hx polyps, fam hx colon ca  Has patient had this procedure before?  Yes, 2012  If so, when, by whom and where?    Is there a family history of colon cancer?  Yes, mother  Who?  What age when diagnosed?    Is patient diabetic?   no      Does patient have prosthetic heart valve or mechanical valve?  no  Do you have a pacemaker?  no  Has patient ever had endocarditis? no  Has patient had joint replacement within last 12 months?  no  Does patient tend to be constipated or take laxatives? no  Does patient have a history of alcohol/drug use?  no  Is patient on Coumadin, Plavix and/or Aspirin? no  Medications: see epic  Allergies: codeine  Medication Adjustment per Dr Laural Golden:   Procedure date & time: 06/09/17 at 145

## 2017-05-10 NOTE — Telephone Encounter (Signed)
agree

## 2017-06-09 ENCOUNTER — Encounter (HOSPITAL_COMMUNITY): Payer: Self-pay

## 2017-06-09 ENCOUNTER — Encounter (HOSPITAL_COMMUNITY)
Admission: RE | Disposition: A | Discharge status: Home / Self Care | Payer: Self-pay | Source: Ambulatory Visit | Attending: Internal Medicine

## 2017-06-09 ENCOUNTER — Ambulatory Visit (HOSPITAL_COMMUNITY)
Admission: RE | Admit: 2017-06-09 | Discharge: 2017-06-09 | Disposition: A | Discharge status: Home / Self Care | Payer: Medicare Other | Source: Ambulatory Visit | Attending: Internal Medicine | Admitting: Internal Medicine

## 2017-06-09 DIAGNOSIS — K5900 Constipation, unspecified: Secondary | ICD-10-CM | POA: Insufficient documentation

## 2017-06-09 DIAGNOSIS — Z9889 Other specified postprocedural states: Secondary | ICD-10-CM | POA: Insufficient documentation

## 2017-06-09 DIAGNOSIS — Z09 Encounter for follow-up examination after completed treatment for conditions other than malignant neoplasm: Secondary | ICD-10-CM | POA: Diagnosis not present

## 2017-06-09 DIAGNOSIS — Z1211 Encounter for screening for malignant neoplasm of colon: Secondary | ICD-10-CM | POA: Diagnosis not present

## 2017-06-09 DIAGNOSIS — Z8249 Family history of ischemic heart disease and other diseases of the circulatory system: Secondary | ICD-10-CM | POA: Diagnosis not present

## 2017-06-09 DIAGNOSIS — D125 Benign neoplasm of sigmoid colon: Secondary | ICD-10-CM | POA: Insufficient documentation

## 2017-06-09 DIAGNOSIS — K648 Other hemorrhoids: Secondary | ICD-10-CM | POA: Diagnosis not present

## 2017-06-09 DIAGNOSIS — D123 Benign neoplasm of transverse colon: Secondary | ICD-10-CM | POA: Insufficient documentation

## 2017-06-09 DIAGNOSIS — K625 Hemorrhage of anus and rectum: Secondary | ICD-10-CM | POA: Insufficient documentation

## 2017-06-09 DIAGNOSIS — Z860100 Personal history of colon polyps, unspecified: Secondary | ICD-10-CM | POA: Insufficient documentation

## 2017-06-09 DIAGNOSIS — K644 Residual hemorrhoidal skin tags: Secondary | ICD-10-CM | POA: Insufficient documentation

## 2017-06-09 DIAGNOSIS — J45909 Unspecified asthma, uncomplicated: Secondary | ICD-10-CM | POA: Insufficient documentation

## 2017-06-09 DIAGNOSIS — Z79899 Other long term (current) drug therapy: Secondary | ICD-10-CM | POA: Diagnosis not present

## 2017-06-09 DIAGNOSIS — Z7951 Long term (current) use of inhaled steroids: Secondary | ICD-10-CM | POA: Insufficient documentation

## 2017-06-09 DIAGNOSIS — Z96641 Presence of right artificial hip joint: Secondary | ICD-10-CM | POA: Insufficient documentation

## 2017-06-09 DIAGNOSIS — L409 Psoriasis, unspecified: Secondary | ICD-10-CM | POA: Insufficient documentation

## 2017-06-09 DIAGNOSIS — Z833 Family history of diabetes mellitus: Secondary | ICD-10-CM | POA: Diagnosis not present

## 2017-06-09 DIAGNOSIS — Z800 Family history of malignant neoplasm of digestive organs: Secondary | ICD-10-CM | POA: Insufficient documentation

## 2017-06-09 DIAGNOSIS — K5730 Diverticulosis of large intestine without perforation or abscess without bleeding: Secondary | ICD-10-CM | POA: Insufficient documentation

## 2017-06-09 HISTORY — PX: COLONOSCOPY: SHX5424

## 2017-06-09 SURGERY — COLONOSCOPY
Anesthesia: Moderate Sedation

## 2017-06-09 MED ORDER — MIDAZOLAM HCL 5 MG/5ML IJ SOLN
INTRAMUSCULAR | Status: AC
  Filled 2017-06-09: qty 10

## 2017-06-09 MED ORDER — MIDAZOLAM HCL 5 MG/5ML IJ SOLN
INTRAMUSCULAR | Status: DC | PRN
  Administered 2017-06-09: 1 mg via INTRAVENOUS
  Administered 2017-06-09: 2 mg via INTRAVENOUS
  Administered 2017-06-09: 1 mg via INTRAVENOUS
  Administered 2017-06-09: 2 mg via INTRAVENOUS

## 2017-06-09 MED ORDER — SPOT INK MARKER SYRINGE KIT
PACK | SUBMUCOSAL | Status: DC | PRN
  Administered 2017-06-09: 7 mL via SUBMUCOSAL

## 2017-06-09 MED ORDER — MEPERIDINE HCL 50 MG/ML IJ SOLN
INTRAMUSCULAR | Status: DC
Start: 2017-06-09 — End: 2017-06-09
  Filled 2017-06-09: qty 1

## 2017-06-09 MED ORDER — SPOT INK MARKER SYRINGE KIT
PACK | SUBMUCOSAL | Status: AC
  Filled 2017-06-09: qty 5

## 2017-06-09 MED ORDER — SIMETHICONE 40 MG/0.6ML PO SUSP
ORAL | Status: DC | PRN
  Administered 2017-06-09: 12:00:00

## 2017-06-09 MED ORDER — SODIUM CHLORIDE 0.9 % IV SOLN
INTRAVENOUS | Status: DC
  Administered 2017-06-09: 1000 mL via INTRAVENOUS

## 2017-06-09 MED ORDER — MEPERIDINE HCL 50 MG/ML IJ SOLN
INTRAMUSCULAR | Status: DC | PRN
  Administered 2017-06-09 (×2): 25 mg via INTRAVENOUS

## 2017-06-09 NOTE — Op Note (Signed)
[facility] Patient Name: Jeffrey Mcmahon Procedure Date: 06/09/2017 11:04 AM MRN: [id number] Date of Birth: 05/30/1951 Attending MD: Hildred Laser , MD CSN: [id number] Age: 66 Admit Type: Outpatient Procedure:                Colonoscopy Indications:              High risk colon cancer surveillance: Personal                            history of colonic polyps Providers:                Hildred Laser, MD, Otis Peak B. Sharon Seller, RN, Rosina Lowenstein, RN Referring MD:             Asencion Noble, MD Medicines:                Meperidine 50 mg IV, Midazolam 6 mg IV Complications:            No immediate complications. Estimated Blood Loss:     Estimated blood loss was minimal. Procedure:                Pre-Anesthesia Assessment:                           - Prior to the procedure, a History and Physical                            was performed, and patient medications and                            allergies were reviewed. The patient's tolerance of                            previous anesthesia was also reviewed. The risks                            and benefits of the procedure and the sedation                            options and risks were discussed with the patient.                            All questions were answered, and informed consent                            was obtained. Prior Anticoagulants: The patient                            last took ibuprofen 3 days prior to the procedure.                            ASA Grade Assessment: II - A patient with mild  systemic disease. After reviewing the risks and                            benefits, the patient was deemed in satisfactory                            condition to undergo the procedure.                           After obtaining informed consent, the colonoscope                            was passed under direct vision. Throughout the                            procedure, the  patient's blood pressure, pulse, and                            oxygen saturations were monitored continuously. The                            [id number] ([id number]) scope was introduced through                            the anus and advanced to the the cecum, identified                            by appendiceal orifice and ileocecal valve. The                            colonoscopy was performed without difficulty. The                            patient tolerated the procedure well. The quality                            of the bowel preparation was good. The ileocecal                            valve, appendiceal orifice, and rectum were                            photographed. Scope In: 11:54:52 AM Scope Out: 12:34:25 PM Scope Withdrawal Time: 0 hours 28 minutes 16 seconds  Total Procedure Duration: 0 hours 39 minutes 33 seconds  Findings:      The perianal and digital rectal examinations were normal.      Multiple small and large-mouthed diverticula were found in the entire       colon.      A 5 mm polyp was found in the transverse colon. The polyp was sessile.       The polyp was removed with a cold snare. Resection and retrieval were       complete. The pathology specimen was placed into Bottle Number 1.      A 20 mm polyp was  found in the sigmoid colon distal sigmoid colon. The       polyp was semi-pedunculated. The polyp was removed with a hot snare.       Resection and retrieval were complete. The pathology specimen was placed       into Bottle Number 2. To close a defect after mucosal resection, three       hemostatic clips were successfully placed (MR conditional). Area was       tattooed with an injection of Spot (carbon black).      External and internal hemorrhoids were found during retroflexion. The       hemorrhoids were small. Impression:               - Diverticulosis in the entire examined colon.                           - One 5 mm polyp in the transverse colon,  removed                            with a cold snare. Resected and retrieved.                           - One 20 mm polyp in the sigmoid colon in the                            distal sigmoid colon, removed with a hot snare.                            Resected and retrieved. Clips (MR conditional) were                            placed. Tattooed.                           - External and internal hemorrhoids. Moderate Sedation:      Moderate (conscious) sedation was administered by the endoscopy nurse       and supervised by the endoscopist. The following parameters were       monitored: oxygen saturation, heart rate, blood pressure, CO2       capnography and response to care. Total physician intraservice time was       45 minutes. Recommendation:           - Patient has a contact number available for                            emergencies. The signs and symptoms of potential                            delayed complications were discussed with the                            patient. Return to normal activities tomorrow.                            Written discharge instructions were provided to the  patient.                           - High fiber diet today.                           - Continue present medications.                           - No aspirin, ibuprofen, naproxen, or other                            non-steroidal anti-inflammatory drugs for 7 days                            after polyp removal.                           - Await pathology results.                           - Repeat colonoscopy date to be determined after                            pending pathology results are reviewed for                            surveillance. Procedure Code(s):        --- Professional ---                           [phone], Colonoscopy, flexible; with removal of                            tumor(s), polyp(s), or other lesion(s) by snare                             technique                           45381, Colonoscopy, flexible; with directed                            submucosal injection(s), any substance                           09983, Moderate sedation services provided by the                            same physician or other qualified health care                            professional performing the diagnostic or                            therapeutic service that the sedation supports,  requiring the presence of an independent trained                            observer to assist in the monitoring of the                            patient's level of consciousness and physiological                            status; initial 15 minutes of intraservice time,                            patient age 1 years or older                           [id number], Moderate sedation services; each additional                            15 minutes intraservice time                           [phone], Moderate sedation services; each additional                            15 minutes intraservice time Diagnosis Code(s):        --- Professional ---                           Z86.010, Personal history of colonic polyps                           K64.8, Other hemorrhoids                           D12.3, Benign neoplasm of transverse colon (hepatic                            flexure or splenic flexure)                           D12.5, Benign neoplasm of sigmoid colon                           K57.30, Diverticulosis of large intestine without                            perforation or abscess without bleeding CPT copyright 2016 [employer]. All rights reserved. The codes documented in this report are preliminary and upon coder review may  be revised to meet current compliance requirements. Hildred Laser, MD Hildred Laser, MD 06/09/2017 12:44:09 PM This report has been signed electronically. Number of Addenda: 0

## 2017-06-09 NOTE — H&P (Signed)
Jeffrey Mcmahon is an 66 y.o. male.   Chief Complaint: Patient is here for colonoscopy. HPI: Patient is 66 year old Caucasian male who has history of colonic adenomas and is here for surveillance colonoscopy.  He had two units removed in 2007.  No polyps were found on last exam of December 2012.  He has noted intermittent rectal bleeding with his bowel movements lately.  He is prone to constipation.  He denies abdominal pain.  He has good appetite. His mother was diagnosed with colon carcinoma at age 7.  She had surgery and died of metastatic disease a year later.  Past Medical History:  Diagnosis Date  . Arthritis   . Asthma   . Colon polyps   . Constipation   . Diverticulitis   . GERD (gastroesophageal reflux disease)    occasional  . Pneumonia    December 2012  . Pneumothorax   . Psoriasis     Past Surgical History:  Procedure Laterality Date  . COLONOSCOPY  07/29/2011   Procedure: COLONOSCOPY;  Surgeon: Rogene Houston, MD;  Location: [facility];  Service: Endoscopy;  Laterality: N/A;  7:30 am  . HERNIA REPAIR  1993  . Right hip surgery  1993   Secondary to a staph infection  . TOTAL HIP ARTHROPLASTY Right 03/14/2013   Procedure: RIGHT TOTAL HIP ARTHROPLASTY ANTERIOR APPROACH;  Surgeon: Mauri Pole, MD;  Location: [facility];  Service: Orthopedics;  Laterality: Right;    Family History  Problem Relation Age of Onset  . Diabetes Father   . Heart attack Father   . Colon cancer Mother    Social History:  reports that he has never smoked. He has never used smokeless tobacco. He reports that he does not drink alcohol or use drugs.  Allergies:  Allergies  Allergen Reactions  . Codeine Nausea Only    Medications Prior to Admission  Medication Sig Dispense Refill  . albuterol (PROVENTIL HFA;VENTOLIN HFA) 108 (90 BASE) MCG/ACT inhaler Inhale 2 puffs into the lungs every 6 (six) hours as needed. For shortness of breath     . Fluticasone-Salmeterol (ADVAIR) 250-50 MCG/DOSE AEPB  Inhale 1 puff into the lungs every other day.    . ibuprofen (ADVIL,MOTRIN) 200 MG tablet Take 400-800 mg by mouth daily as needed for headache or moderate pain.    Marland Kitchen doxycycline (VIBRA-TABS) 100 MG tablet Take 1 tablet (100 mg total) by mouth every 12 (twelve) hours. (Patient not taking: Reported on 06/02/2017) 10 tablet 0  . mometasone-formoterol (DULERA) 200-5 MCG/ACT AERO Inhale 2 puffs into the lungs 2 (two) times daily. (Patient not taking: Reported on 06/02/2017) 1 Inhaler 0  . predniSONE (DELTASONE) 20 MG tablet Prednisone 40 mg daily for 5 days and stop (Patient not taking: Reported on 06/02/2017) 5 tablet 0    No results found for this or any previous visit (from the past 48 hour(s)). No results found.  ROS  Blood pressure 134/79, pulse 72, temperature 97.7 F (36.5 C), temperature source Oral, resp. rate 12, height 5\' 11"  (1.803 m), weight 205 lb (93 kg), SpO2 99 %. Physical Exam  Constitutional: He appears well-developed and well-nourished.  HENT:  Mouth/Throat: Oropharynx is clear and moist.  Eyes: Conjunctivae are normal. No scleral icterus.  Neck: No thyromegaly present.  Cardiovascular: Normal rate, regular rhythm and normal heart sounds.   No murmur heard. Respiratory: Effort normal and breath sounds normal.  GI:  Abdomen is full.  It is soft and nontender without organomegaly or masses.  Musculoskeletal: He exhibits no edema.  Lymphadenopathy:    He has no cervical adenopathy.  Neurological: He is alert.  Skin: Skin is warm and dry.     Assessment/Plan History of colonic adenomas. Family history of CRC in mother at late onset. Surveillance colonoscopy.  Hildred Laser, MD 06/09/2017, 11:45 AM

## 2017-06-09 NOTE — Discharge Instructions (Signed)
No aspirin or NSAIDs for 1 week. Resume other medications as before. High-fiber diet. No driving for 24 hours. Physician will call with biopsy results.  Colonoscopy, Adult, Care After This sheet gives you information about how to care for yourself after your procedure. Your doctor may also give you more specific instructions. If you have problems or questions, call your doctor. Follow these instructions at home: General instructions   For the first 24 hours after the procedure: ? Do not drive or use machinery. ? Do not sign important documents. ? Do not drink alcohol. ? Do your daily activities more slowly than normal. ? Eat foods that are soft and easy to digest. ? Rest often.  Take over-the-counter or prescription medicines only as told by your doctor.  It is up to you to get the results of your procedure. Ask your doctor, or the department performing the procedure, when your results will be ready. To help cramping and bloating:  Try walking around.  Put heat on your belly (abdomen) as told by your doctor. Use a heat source that your doctor recommends, such as a moist heat pack or a heating pad. ? Put a towel between your skin and the heat source. ? Leave the heat on for 20-30 minutes. ? Remove the heat if your skin turns bright red. This is especially important if you cannot feel pain, heat, or cold. You can get burned. Eating and drinking  Drink enough fluid to keep your pee (urine) clear or pale yellow.  Return to your normal diet as told by your doctor. Avoid heavy or fried foods that are hard to digest.  Avoid drinking alcohol for as long as told by your doctor. Contact a doctor if:  You have blood in your poop (stool) 2-3 days after the procedure. Get help right away if:  You have more than a small amount of blood in your poop.  You see large clumps of tissue (blood clots) in your poop.  Your belly is swollen.  You feel sick to your stomach (nauseous).  You  throw up (vomit).  You have a fever.  You have belly pain that gets worse, and medicine does not help your pain. This information is not intended to replace advice given to you by your health care provider. Make sure you discuss any questions you have with your health care provider. Document Released: 08/29/2010 Document Revised: 04/20/2016 Document Reviewed: 04/20/2016 Elsevier Interactive Patient Education  2017 [employer].  High-Fiber Diet Fiber, also called dietary fiber, is a type of carbohydrate found in fruits, vegetables, whole grains, and beans. A high-fiber diet can have many health benefits. Your health care provider may recommend a high-fiber diet to help:  Prevent constipation. Fiber can make your bowel movements more regular.  Lower your cholesterol.  Relieve hemorrhoids, uncomplicated diverticulosis, or irritable bowel syndrome.  Prevent overeating as part of a weight-loss plan.  Prevent heart disease, type 2 diabetes, and certain cancers.  What is my plan? The recommended daily intake of fiber includes:  38 grams for men under age 33.  65 grams for men over age 74.  63 grams for women under age 71.  58 grams for women over age 38.  You can get the recommended daily intake of dietary fiber by eating a variety of fruits, vegetables, grains, and beans. Your health care provider may also recommend a fiber supplement if it is not possible to get enough fiber through your diet. What do I need to know  about a high-fiber diet?  Fiber supplements have not been widely studied for their effectiveness, so it is better to get fiber through food sources.  Always check the fiber content on thenutrition facts label of any prepackaged food. Look for foods that contain at least 5 grams of fiber per serving.  Ask your dietitian if you have questions about specific foods that are related to your condition, especially if those foods are not listed in the following  section.  Increase your daily fiber consumption gradually. Increasing your intake of dietary fiber too quickly may cause bloating, cramping, or gas.  Drink plenty of water. Water helps you to digest fiber. What foods can I eat? Grains Whole-grain breads. Multigrain cereal. Oats and oatmeal. Brown rice. Barley. Bulgur wheat. [place]. Bran muffins. Popcorn. Rye wafer crackers. Vegetables Sweet potatoes. Spinach. Kale. Artichokes. Cabbage. Broccoli. Green peas. Carrots. Squash. Fruits Berries. Pears. Apples. Oranges. Avocados. Prunes and raisins. Dried figs. Meats and Other Protein Sources [place], kidney, pinto, and soy beans. Split peas. Lentils. Nuts and seeds. Dairy Fiber-fortified yogurt. Beverages Fiber-fortified soy milk. Fiber-fortified orange juice. Other Fiber bars. The items listed above may not be a complete list of recommended foods or beverages. Contact your dietitian for more options. What foods are not recommended? Grains White bread. Pasta made with refined flour. White rice. Vegetables Fried potatoes. Canned vegetables. Well-cooked vegetables. Fruits Fruit juice. Cooked, strained fruit. Meats and Other Protein Sources Fatty cuts of meat. Fried Sales executive or fried fish. Dairy Milk. Yogurt. Cream cheese. Sour cream. Beverages Soft drinks. Other Cakes and pastries. Butter and oils. The items listed above may not be a complete list of foods and beverages to avoid. Contact your dietitian for more information. What are some tips for including high-fiber foods in my diet?  Eat a wide variety of high-fiber foods.  Make sure that half of all grains consumed each day are whole grains.  Replace breads and cereals made from refined flour or white flour with whole-grain breads and cereals.  Replace white rice with brown rice, bulgur wheat, or millet.  Start the day with a breakfast that is high in fiber, such as a cereal that contains at least 5 grams of fiber per  serving.  Use beans in place of meat in soups, salads, or pasta.  Eat high-fiber snacks, such as berries, raw vegetables, nuts, or popcorn. This information is not intended to replace advice given to you by your health care provider. Make sure you discuss any questions you have with your health care provider. Document Released: 07/27/2005 Document Revised: 01/02/2016 Document Reviewed: 01/09/2014 Elsevier Interactive Patient Education  2017 [employer].

## 2017-06-10 ENCOUNTER — Encounter (HOSPITAL_COMMUNITY): Payer: Self-pay | Admitting: Internal Medicine

## 2017-07-16 DIAGNOSIS — Z7689 Persons encountering health services in other specified circumstances: Secondary | ICD-10-CM | POA: Diagnosis not present

## 2017-07-23 DIAGNOSIS — J4540 Moderate persistent asthma, uncomplicated: Secondary | ICD-10-CM | POA: Diagnosis not present

## 2017-07-23 DIAGNOSIS — Z0001 Encounter for general adult medical examination with abnormal findings: Secondary | ICD-10-CM | POA: Diagnosis not present

## 2017-07-23 DIAGNOSIS — R7301 Impaired fasting glucose: Secondary | ICD-10-CM | POA: Diagnosis not present

## 2017-07-23 DIAGNOSIS — Z23 Encounter for immunization: Secondary | ICD-10-CM | POA: Diagnosis not present

## 2018-01-25 DIAGNOSIS — L400 Psoriasis vulgaris: Secondary | ICD-10-CM | POA: Diagnosis not present

## 2018-01-26 DIAGNOSIS — Z79899 Other long term (current) drug therapy: Secondary | ICD-10-CM | POA: Diagnosis not present

## 2018-01-26 DIAGNOSIS — L400 Psoriasis vulgaris: Secondary | ICD-10-CM | POA: Diagnosis not present

## 2018-02-24 DIAGNOSIS — Z79899 Other long term (current) drug therapy: Secondary | ICD-10-CM | POA: Diagnosis not present

## 2018-02-24 DIAGNOSIS — L400 Psoriasis vulgaris: Secondary | ICD-10-CM | POA: Diagnosis not present

## 2018-03-08 DIAGNOSIS — L400 Psoriasis vulgaris: Secondary | ICD-10-CM | POA: Diagnosis not present

## 2018-04-01 DIAGNOSIS — L400 Psoriasis vulgaris: Secondary | ICD-10-CM | POA: Diagnosis not present

## 2018-04-01 DIAGNOSIS — Z79899 Other long term (current) drug therapy: Secondary | ICD-10-CM | POA: Diagnosis not present

## 2018-04-05 DIAGNOSIS — L400 Psoriasis vulgaris: Secondary | ICD-10-CM | POA: Diagnosis not present

## 2018-04-29 DIAGNOSIS — L400 Psoriasis vulgaris: Secondary | ICD-10-CM | POA: Diagnosis not present

## 2018-04-29 DIAGNOSIS — Z79899 Other long term (current) drug therapy: Secondary | ICD-10-CM | POA: Diagnosis not present

## 2018-05-04 DIAGNOSIS — L400 Psoriasis vulgaris: Secondary | ICD-10-CM | POA: Diagnosis not present

## 2018-05-27 DIAGNOSIS — L400 Psoriasis vulgaris: Secondary | ICD-10-CM | POA: Diagnosis not present

## 2018-05-27 DIAGNOSIS — Z79899 Other long term (current) drug therapy: Secondary | ICD-10-CM | POA: Diagnosis not present

## 2018-06-01 DIAGNOSIS — L400 Psoriasis vulgaris: Secondary | ICD-10-CM | POA: Diagnosis not present

## 2018-06-06 ENCOUNTER — Encounter (INDEPENDENT_AMBULATORY_CARE_PROVIDER_SITE_OTHER): Payer: Self-pay | Admitting: *Deleted

## 2018-06-30 ENCOUNTER — Other Ambulatory Visit (INDEPENDENT_AMBULATORY_CARE_PROVIDER_SITE_OTHER): Payer: Self-pay | Admitting: *Deleted

## 2018-06-30 DIAGNOSIS — Z800 Family history of malignant neoplasm of digestive organs: Secondary | ICD-10-CM

## 2018-06-30 DIAGNOSIS — Z86010 Personal history of colonic polyps: Secondary | ICD-10-CM

## 2018-07-13 ENCOUNTER — Encounter (INDEPENDENT_AMBULATORY_CARE_PROVIDER_SITE_OTHER): Payer: Self-pay | Admitting: *Deleted

## 2018-07-13 ENCOUNTER — Telehealth (INDEPENDENT_AMBULATORY_CARE_PROVIDER_SITE_OTHER): Payer: Self-pay | Admitting: *Deleted

## 2018-07-13 NOTE — Telephone Encounter (Signed)
Patient needs trilyte 

## 2018-07-14 MED ORDER — PEG 3350-KCL-NA BICARB-NACL 420 G PO SOLR: 4000.0000 mL | Freq: Once | ORAL | 0 refills | Status: AC

## 2018-07-26 DIAGNOSIS — L400 Psoriasis vulgaris: Secondary | ICD-10-CM | POA: Diagnosis not present

## 2018-07-26 DIAGNOSIS — Z79899 Other long term (current) drug therapy: Secondary | ICD-10-CM | POA: Diagnosis not present

## 2018-07-27 DIAGNOSIS — L400 Psoriasis vulgaris: Secondary | ICD-10-CM | POA: Diagnosis not present

## 2018-08-09 ENCOUNTER — Telehealth (INDEPENDENT_AMBULATORY_CARE_PROVIDER_SITE_OTHER): Payer: Self-pay | Admitting: *Deleted

## 2018-08-09 NOTE — Telephone Encounter (Signed)
agree

## 2018-08-09 NOTE — Telephone Encounter (Signed)
Referring MD/PCP: fagan   Procedure: tcs  Reason/Indication:  Hx polyps, fam hx colon ca  Has patient had this procedure before?  Yes, 2012             If so, when, by whom and where?    Is there a family history of colon cancer?  Yes, mother             Who?  What age when diagnosed?    Is patient diabetic?   no                                                  Does patient have prosthetic heart valve or mechanical valve?  no  Do you have a pacemaker?  no  Has patient ever had endocarditis? no  Has patient had joint replacement within last 12 months?  no  Does patient tend to be constipated or take laxatives? no  Does patient have a history of alcohol/drug use?  no  Is patient on Coumadin, Plavix and/or Aspirin? no  Medications: see epic  Allergies: codeine  Medication Adjustment per Dr Laural Golden:   Procedure date & time: 09/08/18 at 1030

## 2018-09-08 ENCOUNTER — Encounter (HOSPITAL_COMMUNITY): Payer: Self-pay | Admitting: *Deleted

## 2018-09-08 ENCOUNTER — Ambulatory Visit (HOSPITAL_COMMUNITY)
Admission: RE | Admit: 2018-09-08 | Discharge: 2018-09-08 | Disposition: A | Discharge status: Home / Self Care | Payer: Medicare Other | Attending: Internal Medicine | Admitting: Internal Medicine

## 2018-09-08 ENCOUNTER — Encounter (HOSPITAL_COMMUNITY)
Admission: RE | Disposition: A | Discharge status: Home / Self Care | Payer: Self-pay | Source: Home / Self Care | Attending: Internal Medicine

## 2018-09-08 ENCOUNTER — Other Ambulatory Visit: Payer: Self-pay

## 2018-09-08 DIAGNOSIS — Z8249 Family history of ischemic heart disease and other diseases of the circulatory system: Secondary | ICD-10-CM | POA: Insufficient documentation

## 2018-09-08 DIAGNOSIS — J45909 Unspecified asthma, uncomplicated: Secondary | ICD-10-CM | POA: Insufficient documentation

## 2018-09-08 DIAGNOSIS — D120 Benign neoplasm of cecum: Secondary | ICD-10-CM | POA: Insufficient documentation

## 2018-09-08 DIAGNOSIS — K219 Gastro-esophageal reflux disease without esophagitis: Secondary | ICD-10-CM | POA: Insufficient documentation

## 2018-09-08 DIAGNOSIS — Z791 Long term (current) use of non-steroidal anti-inflammatories (NSAID): Secondary | ICD-10-CM | POA: Insufficient documentation

## 2018-09-08 DIAGNOSIS — K644 Residual hemorrhoidal skin tags: Secondary | ICD-10-CM | POA: Diagnosis not present

## 2018-09-08 DIAGNOSIS — K648 Other hemorrhoids: Secondary | ICD-10-CM | POA: Insufficient documentation

## 2018-09-08 DIAGNOSIS — Z09 Encounter for follow-up examination after completed treatment for conditions other than malignant neoplasm: Secondary | ICD-10-CM | POA: Diagnosis not present

## 2018-09-08 DIAGNOSIS — D125 Benign neoplasm of sigmoid colon: Secondary | ICD-10-CM | POA: Diagnosis not present

## 2018-09-08 DIAGNOSIS — Z860100 Personal history of colon polyps, unspecified: Secondary | ICD-10-CM

## 2018-09-08 DIAGNOSIS — Z79899 Other long term (current) drug therapy: Secondary | ICD-10-CM | POA: Diagnosis not present

## 2018-09-08 DIAGNOSIS — M1990 Unspecified osteoarthritis, unspecified site: Secondary | ICD-10-CM | POA: Insufficient documentation

## 2018-09-08 DIAGNOSIS — Z7951 Long term (current) use of inhaled steroids: Secondary | ICD-10-CM | POA: Insufficient documentation

## 2018-09-08 DIAGNOSIS — K5730 Diverticulosis of large intestine without perforation or abscess without bleeding: Secondary | ICD-10-CM | POA: Insufficient documentation

## 2018-09-08 DIAGNOSIS — Z800 Family history of malignant neoplasm of digestive organs: Secondary | ICD-10-CM | POA: Diagnosis not present

## 2018-09-08 HISTORY — PX: COLONOSCOPY: SHX5424

## 2018-09-08 HISTORY — PX: POLYPECTOMY: SHX5525

## 2018-09-08 SURGERY — COLONOSCOPY
Anesthesia: Moderate Sedation

## 2018-09-08 MED ORDER — MEPERIDINE HCL 50 MG/ML IJ SOLN
INTRAMUSCULAR | Status: AC
  Filled 2018-09-08: qty 1

## 2018-09-08 MED ORDER — MEPERIDINE HCL 50 MG/ML IJ SOLN
INTRAMUSCULAR | Status: DC | PRN
  Administered 2018-09-08 (×2): 25 mg

## 2018-09-08 MED ORDER — MIDAZOLAM HCL 5 MG/5ML IJ SOLN
INTRAMUSCULAR | Status: AC
  Filled 2018-09-08: qty 10

## 2018-09-08 MED ORDER — SODIUM CHLORIDE 0.9 % IV SOLN
INTRAVENOUS | Status: DC
  Administered 2018-09-08: 10:00:00 via INTRAVENOUS

## 2018-09-08 MED ORDER — MIDAZOLAM HCL 5 MG/5ML IJ SOLN
INTRAMUSCULAR | Status: DC | PRN
  Administered 2018-09-08: 2 mg via INTRAVENOUS
  Administered 2018-09-08: 1 mg via INTRAVENOUS
  Administered 2018-09-08: 2 mg via INTRAVENOUS

## 2018-09-08 NOTE — Discharge Instructions (Signed)
No aspirin or NSAIDs for 1 week. Resume other medications as before. High-fiber diet. No driving for 24 hours. Physician will call with biopsy results and further recommendations.  PATIENT INSTRUCTIONS POST-ANESTHESIA  IMMEDIATELY FOLLOWING SURGERY:  Do not drive or operate machinery for the first twenty four hours after surgery.  Do not make any important decisions for twenty four hours after surgery or while taking narcotic pain medications or sedatives.  If you develop intractable nausea and vomiting or a severe headache please notify your doctor immediately.  FOLLOW-UP:  Please make an appointment with your surgeon as instructed. You do not need to follow up with anesthesia unless specifically instructed to do so.  WOUND CARE INSTRUCTIONS (if applicable):  Keep a dry clean dressing on the anesthesia/puncture wound site if there is drainage.  Once the wound has quit draining you may leave it open to air.  Generally you should leave the bandage intact for twenty four hours unless there is drainage.  If the epidural site drains for more than 36-48 hours please call the anesthesia department.  QUESTIONS?:  Please feel free to call your physician or the hospital operator if you have any questions, and they will be happy to assist you.      Colonoscopy, Adult, Care After This sheet gives you information about how to care for yourself after your procedure. Your doctor may also give you more specific instructions. If you have problems or questions, call your doctor. What can I expect after the procedure? After the procedure, it is common to have:  A small amount of blood in your poop for 24 hours.  Some gas.  Mild cramping or bloating in your belly. Follow these instructions at home: General instructions  For the first 24 hours after the procedure: ? Do not drive or use machinery. ? Do not sign important documents. ? Do not drink alcohol. ? Do your daily activities more slowly than  normal. ? Eat foods that are soft and easy to digest.  Take over-the-counter or prescription medicines only as told by your doctor. To help cramping and bloating:   Try walking around.  Put heat on your belly (abdomen) as told by your doctor. Use a heat source that your doctor recommends, such as a moist heat pack or a heating pad. ? Put a towel between your skin and the heat source. ? Leave the heat on for 20-30 minutes. ? Remove the heat if your skin turns bright red. This is especially important if you cannot feel pain, heat, or cold. You can get burned. Eating and drinking   Drink enough fluid to keep your pee (urine) clear or pale yellow.  Return to your normal diet as told by your doctor. Avoid heavy or fried foods that are hard to digest.  Avoid drinking alcohol for as long as told by your doctor. Contact a doctor if:  You have blood in your poop (stool) 2-3 days after the procedure. Get help right away if:  You have more than a small amount of blood in your poop.  You see large clumps of tissue (blood clots) in your poop.  Your belly is swollen.  You feel sick to your stomach (nauseous).  You throw up (vomit).  You have a fever.  You have belly pain that gets worse, and medicine does not help your pain. Summary  After the procedure, it is common to have a small amount of blood in your poop. You may also have mild cramping and  bloating in your belly.  For the first 24 hours after the procedure, do not drive or use machinery, do not sign important documents, and do not drink alcohol.  Get help right away if you have a lot of blood in your poop, feel sick to your stomach, have a fever, or have more belly pain. This information is not intended to replace advice given to you by your health care provider. Make sure you discuss any questions you have with your health care provider. Document Released: 08/29/2010 Document Revised: 05/27/2017 Document Reviewed:  04/20/2016 Elsevier Interactive Patient Education  2019 [place].   Colon Polyps  Polyps are tissue growths inside the body. Polyps can grow in many places, including the large intestine (colon). A polyp may be a round bump or a mushroom-shaped growth. You could have one polyp or several. Most colon polyps are noncancerous (benign). However, some colon polyps can become cancerous over time. Finding and removing the polyps early can help prevent this. What are the causes? The exact cause of colon polyps is not known. What increases the risk? You are more likely to develop this condition if you:  Have a family history of colon cancer or colon polyps.  Are older than 24 or older than 45 if you are African American.  Have inflammatory bowel disease, such as ulcerative colitis or Crohn's disease.  Have certain hereditary conditions, such as: ? Familial adenomatous polyposis. ? Lynch syndrome. ? Turcot syndrome. ? Peutz-Jeghers syndrome.  Are overweight.  Smoke cigarettes.  Do not get enough exercise.  Drink too much alcohol.  Eat a diet that is high in fat and red meat and low in fiber.  Had childhood cancer that was treated with abdominal radiation. What are the signs or symptoms? Most polyps do not cause symptoms. If you have symptoms, they may include:  Blood coming from your rectum when having a bowel movement.  Blood in your stool. The stool may look dark red or black.  Abdominal pain.  A change in bowel habits, such as constipation or diarrhea. How is this diagnosed? This condition is diagnosed with a colonoscopy. This is a procedure in which a lighted, flexible scope is inserted into the anus and then passed into the colon to examine the area. Polyps are sometimes found when a colonoscopy is done as part of routine cancer screening tests. How is this treated? Treatment for this condition involves removing any polyps that are found. Most polyps can be removed  during a colonoscopy. Those polyps will then be tested for cancer. Additional treatment may be needed depending on the results of testing. Follow these instructions at home: Lifestyle  Maintain a healthy weight, or lose weight if recommended by your health care provider.  Exercise every day or as told by your health care provider.  Do not use any products that contain nicotine or tobacco, such as cigarettes and e-cigarettes. If you need help quitting, ask your health care provider.  If you drink alcohol, limit how much you have: ? 0-1 drink a day for women. ? 0-2 drinks a day for men.  Be aware of how much alcohol is in your drink. In the [place] one drink equals one 12 oz bottle of beer (355 mL), one 5 oz glass of wine (148 mL), or one 1 oz shot of hard liquor (44 mL). Eating and drinking   Eat foods that are high in fiber, such as fruits, vegetables, and whole grains.  Eat foods that are high  in calcium and vitamin D, such as milk, cheese, yogurt, eggs, liver, fish, and broccoli.  Limit foods that are high in fat, such as fried foods and desserts.  Limit the amount of red meat and processed meat you eat, such as hot dogs, sausage, bacon, and lunch meats. General instructions  Keep all follow-up visits as told by your health care provider. This is important. ? This includes having regularly scheduled colonoscopies. ? Talk to your health care provider about when you need a colonoscopy. Contact a health care provider if:  You have new or worsening bleeding during a bowel movement.  You have new or increased blood in your stool.  You have a change in bowel habits.  You lose weight for no known reason. Summary  Polyps are tissue growths inside the body. Polyps can grow in many places, including the colon.  Most colon polyps are noncancerous (benign), but some can become cancerous over time.  This condition is diagnosed with a colonoscopy.  Treatment for this condition  involves removing any polyps that are found. Most polyps can be removed during a colonoscopy. This information is not intended to replace advice given to you by your health care provider. Make sure you discuss any questions you have with your health care provider. Document Released: 04/22/2004 Document Revised: 11/11/2017 Document Reviewed: 11/11/2017 Elsevier Interactive Patient Education  2019 [employer]   Hemorrhoids Hemorrhoids are swollen veins in and around the rectum or anus. There are two types of hemorrhoids:  Internal hemorrhoids. These occur in the veins that are just inside the rectum. They may poke through to the outside and become irritated and painful.  External hemorrhoids. These occur in the veins that are outside the anus and can be felt as a painful swelling or hard lump near the anus. Most hemorrhoids do not cause serious problems, and they can be managed with home treatments such as diet and lifestyle changes. If home treatments do not help the symptoms, procedures can be done to shrink or remove the hemorrhoids. What are the causes? This condition is caused by increased pressure in the anal area. This pressure may result from various things, including:  Constipation.  Straining to have a bowel movement.  Diarrhea.  Pregnancy.  Obesity.  Sitting for long periods of time.  Heavy lifting or other activity that causes you to strain.  Anal sex.  Riding a bike for a long period of time. What are the signs or symptoms? Symptoms of this condition include:  Pain.  Anal itching or irritation.  Rectal bleeding.  Leakage of stool (feces).  Anal swelling.  One or more lumps around the anus. How is this diagnosed? This condition can often be diagnosed through a visual exam. Other exams or tests may also be done, such as:  An exam that involves feeling the rectal area with a gloved hand (digital rectal exam).  An exam of the anal canal that is done using  a small tube (anoscope).  A blood test, if you have lost a significant amount of blood.  A test to look inside the colon using a flexible tube with a camera on the end (sigmoidoscopy or colonoscopy). How is this treated? This condition can usually be treated at home. However, various procedures may be done if dietary changes, lifestyle changes, and other home treatments do not help your symptoms. These procedures can help make the hemorrhoids smaller or remove them completely. Some of these procedures involve surgery, and others do not. Common  procedures include:  Rubber band ligation. Rubber bands are placed at the base of the hemorrhoids to cut off their blood supply.  Sclerotherapy. Medicine is injected into the hemorrhoids to shrink them.  Infrared coagulation. A type of light energy is used to get rid of the hemorrhoids.  Hemorrhoidectomy surgery. The hemorrhoids are surgically removed, and the veins that supply them are tied off.  Stapled hemorrhoidopexy surgery. The surgeon staples the base of the hemorrhoid to the rectal wall. Follow these instructions at home: Eating and drinking   Eat foods that have a lot of fiber in them, such as whole grains, beans, nuts, fruits, and vegetables.  Ask your health care provider about taking products that have added fiber (fiber supplements).  Reduce the amount of fat in your diet. You can do this by eating low-fat dairy products, eating less red meat, and avoiding processed foods.  Drink enough fluid to keep your urine pale yellow. Managing pain and swelling   Take warm sitz baths for 20 minutes, 3-4 times a day to ease pain and discomfort. You may do this in a bathtub or using a portable sitz bath that fits over the toilet.  If directed, apply ice to the affected area. Using ice packs between sitz baths may be helpful. ? Put ice in a plastic bag. ? Place a towel between your skin and the bag. ? Leave the ice on for 20 minutes, 2-3  times a day. General instructions  Take over-the-counter and prescription medicines only as told by your health care provider.  Use medicated creams or suppositories as told.  Get regular exercise. Ask your health care provider how much and what kind of exercise is best for you. In general, you should do moderate exercise for at least 30 minutes on most days of the week (150 minutes each week). This can include activities such as walking, biking, or yoga.  Go to the bathroom when you have the urge to have a bowel movement. Do not wait.  Avoid straining to have bowel movements.  Keep the anal area dry and clean. Use wet toilet paper or moist towelettes after a bowel movement.  Do not sit on the toilet for long periods of time. This increases blood pooling and pain.  Keep all follow-up visits as told by your health care provider. This is important. Contact a health care provider if you have:  Increasing pain and swelling that are not controlled by treatment or medicine.  Difficulty having a bowel movement, or you are unable to have a bowel movement.  Pain or inflammation outside the area of the hemorrhoids. Get help right away if you have:  Uncontrolled bleeding from your rectum. Summary  Hemorrhoids are swollen veins in and around the rectum or anus.  Most hemorrhoids can be managed with home treatments such as diet and lifestyle changes.  Taking warm sitz baths can help ease pain and discomfort.  In severe cases, procedures or surgery can be done to shrink or remove the hemorrhoids. This information is not intended to replace advice given to you by your health care provider. Make sure you discuss any questions you have with your health care provider. Document Released: 07/24/2000 Document Revised: 12/16/2017 Document Reviewed: 12/16/2017 Elsevier Interactive Patient Education  2019 [employer].

## 2018-09-08 NOTE — Op Note (Signed)
[facility] Patient Name: Jeffrey Mcmahon Procedure Date: 09/08/2018 10:33 AM MRN: [id number] Date of Birth: 02-10-1951 Attending MD: Hildred Laser , MD CSN: [id number] Age: 68 Admit Type: Outpatient Procedure:                Colonoscopy Indications:              High risk colon cancer surveillance: Personal                            history of colonic polyps Providers:                Hildred Laser, MD, Janeece Riggers, RN, Nelma Rothman,                            Technician Referring MD:             Asencion Noble, MD Medicines:                Meperidine 50 mg IV, Midazolam 5 mg IV Complications:            No immediate complications. Estimated Blood Loss:     Estimated blood loss was minimal. Procedure:                Pre-Anesthesia Assessment:                           - Prior to the procedure, a History and Physical                            was performed, and patient medications and                            allergies were reviewed. The patient's tolerance of                            previous anesthesia was also reviewed. The risks                            and benefits of the procedure and the sedation                            options and risks were discussed with the patient.                            All questions were answered, and informed consent                            was obtained. Prior Anticoagulants: The patient                            last took ibuprofen 4 days prior to the procedure.                            ASA Grade Assessment: II - A patient with mild  systemic disease. After reviewing the risks and                            benefits, the patient was deemed in satisfactory                            condition to undergo the procedure.                           After obtaining informed consent, the colonoscope                            was passed under direct vision. Throughout the                            procedure, the  patient's blood pressure, pulse, and                            oxygen saturations were monitored continuously. The                            PCF-H190DL ([id number]) scope was introduced through                            the anus and advanced to the the cecum, identified                            by appendiceal orifice and ileocecal valve. The                            colonoscopy was performed without difficulty. The                            patient tolerated the procedure well. The quality                            of the bowel preparation was excellent. The                            ileocecal valve, appendiceal orifice, and rectum                            were photographed. Scope In: 10:46:22 AM Scope Out: 11:15:16 AM Scope Withdrawal Time: 0 hours 21 minutes 30 seconds  Total Procedure Duration: 0 hours 28 minutes 54 seconds  Findings:      The perianal and digital rectal examinations were normal.      A small polyp was found in the cecum. The polyp was sessile. The polyp       was removed with a cold snare. Resection and retrieval were complete.       The pathology specimen was placed into Bottle Number 1.      A small polyp was found in the cecum. The polyp was sessile. Biopsies       were taken with a cold forceps for histology. The pathology  specimen was       placed into Bottle Number 1.      Multiple medium-mouthed diverticula were found in the entire colon.      A 5 mm polyp was found in the distal sigmoid colon. The polyp was       removed with a hot snare. Resection and retrieval were complete. The       pathology specimen was placed into Bottle Number 2.      External and internal hemorrhoids were found during retroflexion. The       hemorrhoids were small. Impression:               - One small polyp in the cecum, removed with a cold                            snare. Resected and retrieved.                           - One small polyp in the cecum. Biopsied.                            - Diverticulosis in the entire examined colon.                           - One 5 mm polyp in the distal sigmoid colon,                            removed with a hot snare. Polyp located across                            tattooed mucosa;? residual polyp. Resected and                            retrieved.                           - External and internal hemorrhoids. Moderate Sedation:      Moderate (conscious) sedation was administered by the endoscopy nurse       and supervised by the endoscopist. The following parameters were       monitored: oxygen saturation, heart rate, blood pressure, CO2       capnography and response to care. Total physician intraservice time was       28 minutes. Recommendation:           - Patient has a contact number available for                            emergencies. The signs and symptoms of potential                            delayed complications were discussed with the                            patient. Return to normal activities tomorrow.  Written discharge instructions were provided to the                            patient.                           - High fiber diet today.                           - Continue present medications.                           - No aspirin, ibuprofen, naproxen, or other                            non-steroidal anti-inflammatory drugs for 7 days                            after polyp removal.                           - Await pathology results.                           - Repeat colonoscopy in 5 years for surveillance. Procedure Code(s):        --- Professional ---                           [phone], Colonoscopy, flexible; with removal of                            tumor(s), polyp(s), or other lesion(s) by snare                            technique                           45380, 59, Colonoscopy, flexible; with biopsy,                            single or multiple                            99153, Moderate sedation; each additional 15                            minutes intraservice time                           G0500, Moderate sedation services provided by the                            same physician or other qualified health care                            professional performing a gastrointestinal  endoscopic service that sedation supports,                            requiring the presence of an independent trained                            observer to assist in the monitoring of the                            patient's level of consciousness and physiological                            status; initial 15 minutes of intra-service time;                            patient age 69 years or older (additional time may                            be reported with [phone], as appropriate) Diagnosis Code(s):        --- Professional ---                           Z86.010, Personal history of colonic polyps                           D12.0, Benign neoplasm of cecum                           D12.5, Benign neoplasm of sigmoid colon                           K64.8, Other hemorrhoids                           K57.30, Diverticulosis of large intestine without                            perforation or abscess without bleeding CPT copyright 2018 American [employer]. All rights reserved. The codes documented in this report are preliminary and upon coder review may  be revised to meet current compliance requirements. Hildred Laser, MD Hildred Laser, MD 09/08/2018 11:24:51 AM This report has been signed electronically. Number of Addenda: 0

## 2018-09-08 NOTE — H&P (Signed)
Jeffrey Mcmahon is an 68 y.o. male.   Chief Complaint: Patient is here for colonoscopy. HPI: Patient is 68 year old Caucasian male who underwent colonoscopy in October 2018 with removal of large polyp from sigmoid colon which was a tubular adenoma.  He has small polyp removed from transverse colon and it had high-grade dysplasia.  He is returning for surveillance colonoscopy.  He denies abdominal pain change in bowel habits or rectal bleeding. Family history significant for CRC in mother who was 31 at the time of diagnosis and died 2 years later of metastatic disease.  He has 3 brothers in good health.  Past Medical History:  Diagnosis Date  . Arthritis   . Asthma   . Colon polyps   . Constipation   . Diverticulitis   . GERD (gastroesophageal reflux disease)    occasional  . Pneumonia    December 2012  . Pneumothorax   . Psoriasis     Past Surgical History:  Procedure Laterality Date  . COLONOSCOPY  07/29/2011   Procedure: COLONOSCOPY;  Surgeon: Rogene Houston, MD;  Location: [facility];  Service: Endoscopy;  Laterality: N/A;  7:30 am  . COLONOSCOPY N/A 06/09/2017   Procedure: COLONOSCOPY;  Surgeon: Rogene Houston, MD;  Location: [facility];  Service: Endoscopy;  Laterality: N/A;  145  . HERNIA REPAIR  1993  . Right hip surgery  1993   Secondary to a staph infection  . TOTAL HIP ARTHROPLASTY Right 03/14/2013   Procedure: RIGHT TOTAL HIP ARTHROPLASTY ANTERIOR APPROACH;  Surgeon: Mauri Pole, MD;  Location: [facility];  Service: Orthopedics;  Laterality: Right;    Family History  Problem Relation Age of Onset  . Diabetes Father   . Heart attack Father   . Colon cancer Mother    Social History:  reports that he has never smoked. He has never used smokeless tobacco. He reports that he does not drink alcohol or use drugs.  Allergies: No Known Allergies  Medications Prior to Admission  Medication Sig Dispense Refill  . albuterol (PROVENTIL HFA;VENTOLIN HFA) 108 (90 BASE)  MCG/ACT inhaler Inhale 1-2 puffs into the lungs every 6 (six) hours as needed. For shortness of breath     . cetirizine (ZYRTEC) 10 MG tablet Take 10 mg by mouth daily as needed for allergies.    . Fluticasone-Salmeterol (ADVAIR) 250-50 MCG/DOSE AEPB Inhale 1 puff into the lungs every other day.    . ibuprofen (ADVIL,MOTRIN) 200 MG tablet Take 400-800 mg by mouth daily as needed for headache or moderate pain.  30 tablet 0  . methotrexate 2.5 MG tablet Take 10 mg by mouth every Friday.  1    No results found for this or any previous visit (from the past 48 hour(s)). No results found.  ROS  Blood pressure 132/84, pulse 70, temperature 97.6 F (36.4 C), temperature source Oral, resp. rate 18, height 5\' 11"  (1.803 m), weight 87.1 kg, SpO2 99 %. Physical Exam  Constitutional: He appears well-developed and well-nourished.  HENT:  Mouth/Throat: Oropharynx is clear and moist.  Eyes: Conjunctivae are normal. No scleral icterus.  Neck: No thyromegaly present.  Cardiovascular: Normal rate and regular rhythm.  No murmur heard. Respiratory: Effort normal and breath sounds normal.  GI: Soft. He exhibits no distension and no mass. There is no abdominal tenderness.  Lymphadenopathy:    He has no cervical adenopathy.  Neurological: He is alert.  Skin: Skin is warm and dry.     Assessment/Plan History of  colonic polyps. Surveillance colonoscopy.  Hildred Laser, MD 09/08/2018, 10:38 AM

## 2018-09-14 ENCOUNTER — Encounter (HOSPITAL_COMMUNITY): Payer: Self-pay | Admitting: Internal Medicine

## 2019-01-13 DIAGNOSIS — L400 Psoriasis vulgaris: Secondary | ICD-10-CM | POA: Diagnosis not present

## 2019-01-13 DIAGNOSIS — Z79899 Other long term (current) drug therapy: Secondary | ICD-10-CM | POA: Diagnosis not present

## 2019-01-17 DIAGNOSIS — L400 Psoriasis vulgaris: Secondary | ICD-10-CM | POA: Diagnosis not present

## 2019-02-16 ENCOUNTER — Other Ambulatory Visit: Payer: Self-pay

## 2019-02-16 ENCOUNTER — Telehealth: Payer: Self-pay | Admitting: Internal Medicine

## 2019-02-16 ENCOUNTER — Emergency Department (HOSPITAL_COMMUNITY): Payer: Medicare Other

## 2019-02-16 ENCOUNTER — Encounter (HOSPITAL_COMMUNITY): Payer: Self-pay | Admitting: Emergency Medicine

## 2019-02-16 ENCOUNTER — Emergency Department (HOSPITAL_COMMUNITY)
Admission: EM | Admit: 2019-02-16 | Discharge: 2019-02-16 | Disposition: A | Discharge status: Home / Self Care | Payer: Medicare Other | Attending: Emergency Medicine | Admitting: Emergency Medicine

## 2019-02-16 DIAGNOSIS — K5730 Diverticulosis of large intestine without perforation or abscess without bleeding: Secondary | ICD-10-CM | POA: Diagnosis not present

## 2019-02-16 DIAGNOSIS — Z20822 Contact with and (suspected) exposure to covid-19: Secondary | ICD-10-CM

## 2019-02-16 DIAGNOSIS — J45909 Unspecified asthma, uncomplicated: Secondary | ICD-10-CM | POA: Insufficient documentation

## 2019-02-16 DIAGNOSIS — R079 Chest pain, unspecified: Secondary | ICD-10-CM | POA: Diagnosis not present

## 2019-02-16 DIAGNOSIS — R109 Unspecified abdominal pain: Secondary | ICD-10-CM | POA: Insufficient documentation

## 2019-02-16 DIAGNOSIS — Z79899 Other long term (current) drug therapy: Secondary | ICD-10-CM | POA: Insufficient documentation

## 2019-02-16 DIAGNOSIS — R509 Fever, unspecified: Secondary | ICD-10-CM | POA: Diagnosis present

## 2019-02-16 DIAGNOSIS — K8020 Calculus of gallbladder without cholecystitis without obstruction: Secondary | ICD-10-CM | POA: Diagnosis not present

## 2019-02-16 DIAGNOSIS — R1011 Right upper quadrant pain: Secondary | ICD-10-CM | POA: Diagnosis not present

## 2019-02-16 LAB — CBC WITH DIFFERENTIAL/PLATELET
Abs Immature Granulocytes: 0.01 10*3/uL (ref 0.00–0.07)
Basophils Absolute: 0 10*3/uL (ref 0.0–0.1)
Basophils Relative: 1 %
Eosinophils Absolute: 0.1 10*3/uL (ref 0.0–0.5)
Eosinophils Relative: 2 %
HCT: 42 % (ref 39.0–52.0)
Hemoglobin: 14.6 g/dL (ref 13.0–17.0)
Immature Granulocytes: 0 %
Lymphocytes Relative: 8 %
Lymphs Abs: 0.4 10*3/uL — ABNORMAL LOW (ref 0.7–4.0)
MCH: 31.1 pg (ref 26.0–34.0)
MCHC: 34.8 g/dL (ref 30.0–36.0)
MCV: 89.6 fL (ref 80.0–100.0)
Monocytes Absolute: 0.4 10*3/uL (ref 0.1–1.0)
Monocytes Relative: 8 %
Neutro Abs: 3.6 10*3/uL (ref 1.7–7.7)
Neutrophils Relative %: 81 %
Platelets: 192 10*3/uL (ref 150–400)
RBC: 4.69 MIL/uL (ref 4.22–5.81)
RDW: 13.8 % (ref 11.5–15.5)
WBC: 4.5 10*3/uL (ref 4.0–10.5)
nRBC: 0 % (ref 0.0–0.2)

## 2019-02-16 LAB — COMPREHENSIVE METABOLIC PANEL
ALT: 34 U/L (ref 0–44)
AST: 29 U/L (ref 15–41)
Albumin: 4.3 g/dL (ref 3.5–5.0)
Alkaline Phosphatase: 41 U/L (ref 38–126)
Anion gap: 9 (ref 5–15)
BUN: 9 mg/dL (ref 8–23)
CO2: 24 mmol/L (ref 22–32)
Calcium: 8.9 mg/dL (ref 8.9–10.3)
Chloride: 105 mmol/L (ref 98–111)
Creatinine, Ser: 0.91 mg/dL (ref 0.61–1.24)
GFR calc Af Amer: >60 mL/min (ref >60)
GFR calc non Af Amer: >60 mL/min (ref >60)
Glucose, Bld: 97 mg/dL (ref 70–99)
Potassium: 3.9 mmol/L (ref 3.5–5.1)
Sodium: 138 mmol/L (ref 135–145)
Total Bilirubin: 1 mg/dL (ref 0.3–1.2)
Total Protein: 6.8 g/dL (ref 6.5–8.1)

## 2019-02-16 LAB — URINALYSIS, ROUTINE W REFLEX MICROSCOPIC
Bilirubin Urine: NEGATIVE
Glucose, UA: NEGATIVE mg/dL
Hgb urine dipstick: NEGATIVE
Ketones, ur: NEGATIVE mg/dL
Leukocytes,Ua: NEGATIVE
Nitrite: NEGATIVE
Protein, ur: NEGATIVE mg/dL
Specific Gravity, Urine: 1.012 (ref 1.005–1.030)
pH: 7 (ref 5.0–8.0)

## 2019-02-16 LAB — TROPONIN I (HIGH SENSITIVITY)
Troponin I (High Sensitivity): 2 ng/L (ref <18)
Troponin I (High Sensitivity): 3 ng/L (ref <18)

## 2019-02-16 LAB — LACTIC ACID, PLASMA
Lactic Acid, Venous: 0.7 mmol/L (ref 0.5–1.9)
Lactic Acid, Venous: 0.6 mmol/L (ref 0.5–1.9)

## 2019-02-16 LAB — LIPASE, BLOOD: Lipase: 31 U/L (ref 11–51)

## 2019-02-16 MED ORDER — MORPHINE SULFATE (PF) 2 MG/ML IV SOLN
2.0000 mg | INTRAVENOUS | Status: DC | PRN
  Administered 2019-02-16: 2 mg via INTRAVENOUS
  Filled 2019-02-16: qty 1

## 2019-02-16 MED ORDER — FAMOTIDINE IN NACL 20-0.9 MG/50ML-% IV SOLN
20.0000 mg | Freq: Once | INTRAVENOUS | Status: AC
  Administered 2019-02-16: 20 mg via INTRAVENOUS
  Filled 2019-02-16: qty 50

## 2019-02-16 MED ORDER — ONDANSETRON HCL 4 MG/2ML IJ SOLN
4.0000 mg | INTRAMUSCULAR | Status: DC | PRN
  Administered 2019-02-16: 4 mg via INTRAVENOUS
  Filled 2019-02-16: qty 2

## 2019-02-16 MED ORDER — IOHEXOL 300 MG/ML  SOLN
100.0000 mL | Freq: Once | INTRAMUSCULAR | Status: AC | PRN
  Administered 2019-02-16: 100 mL via INTRAVENOUS

## 2019-02-16 NOTE — ED Provider Notes (Signed)
[facility] EMERGENCY DEPARTMENT Provider Note   CSN: [id number] Arrival date & time: 02/16/19  1024     History   Chief Complaint Chief Complaint  Patient presents with   Fever   Abdominal Pain    HPI Jeffrey Mcmahon is a 68 y.o. male.     HPI  Pt was seen at 1045. Per pt, c/o gradual onset and persistence of constant upper abd "pains" for the past 3 days. Has been associated with "fevers of 98.4" per pt. Pt states he believes his lower back "hurts after laying down all day yesterday." States he "might have coughed once yesterday."  Denies any temps greater than 98.4. Denies CP/palpitations, no SOB, no N/V/D, no rash, no injury, no focal motor weakness, no tingling/numbness in extremities, no sore throat.     Past Medical History:  Diagnosis Date   Arthritis    Asthma    Colon polyps    Constipation    Diverticulitis    GERD (gastroesophageal reflux disease)    occasional   Pneumonia    December 2012   Pneumothorax    Psoriasis     Patient Active Problem List   Diagnosis Date Noted   Family history of colon cancer 03/01/2017   History of colonic polyps 03/01/2017   Asthma 08/14/2016   Asthma exacerbation 08/14/2016   Overweight (BMI 25.0-29.9) 03/15/2013   S/P right THA, AA 03/14/2013    Past Surgical History:  Procedure Laterality Date   COLONOSCOPY  07/29/2011   Procedure: COLONOSCOPY;  Surgeon: Rogene Houston, MD;  Location: [facility];  Service: Endoscopy;  Laterality: N/A;  7:30 am   COLONOSCOPY N/A 06/09/2017   Procedure: COLONOSCOPY;  Surgeon: Rogene Houston, MD;  Location: [facility];  Service: Endoscopy;  Laterality: N/A;  145   COLONOSCOPY N/A 09/08/2018   Procedure: COLONOSCOPY;  Surgeon: Rogene Houston, MD;  Location: [facility];  Service: Endoscopy;  Laterality: N/A;  [place]   POLYPECTOMY  09/08/2018   Procedure: POLYPECTOMY;  Surgeon: Rogene Houston, MD;  Location: [facility];  Service:  Endoscopy;;   Right hip surgery  1993   Secondary to a staph infection   TOTAL HIP ARTHROPLASTY Right 03/14/2013   Procedure: RIGHT TOTAL HIP ARTHROPLASTY ANTERIOR APPROACH;  Surgeon: Mauri Pole, MD;  Location: [facility];  Service: Orthopedics;  Laterality: Right;        Home Medications    Prior to Admission medications   Medication Sig Start Date End Date Taking? Authorizing Provider  albuterol (PROVENTIL HFA;VENTOLIN HFA) 108 (90 BASE) MCG/ACT inhaler Inhale 1-2 puffs into the lungs every 6 (six) hours as needed. For shortness of breath     [provider]  cetirizine (ZYRTEC) 10 MG tablet Take 10 mg by mouth daily as needed for allergies.    [provider]  Fluticasone-Salmeterol (ADVAIR) 250-50 MCG/DOSE AEPB Inhale 1 puff into the lungs every other day.    [provider]  ibuprofen (ADVIL,MOTRIN) 200 MG tablet Take 400-800 mg by mouth daily as needed for headache or moderate pain.  06/16/17   Rogene Houston, MD  methotrexate 2.5 MG tablet Take 10 mg by mouth every Friday. 06/28/18   [provider]    Family History Family History  Problem Relation Age of Onset   Diabetes Father    Heart attack Father    Colon cancer Mother     Social History Social History  Tobacco Use   Smoking status: Never Smoker   Smokeless tobacco: Never Used  Substance Use Topics   Alcohol use: No   Drug use: No     Allergies   Patient has no known allergies.   Review of Systems Review of Systems ROS: Statement: All systems negative except as marked or noted in the HPI; Constitutional: Negative for fever and chills. ; ; Eyes: Negative for eye pain, redness and discharge. ; ; ENMT: Negative for ear pain, hoarseness, nasal congestion, sinus pressure and sore throat. ; ; Cardiovascular: Negative for chest pain, palpitations, diaphoresis, dyspnea and peripheral edema. ; ; Respiratory: +cough. Negative for wheezing and stridor. ; ; Gastrointestinal:  +abd pain. Negative for nausea, vomiting, diarrhea, blood in stool, hematemesis, jaundice and rectal bleeding. . ; ; Genitourinary: Negative for dysuria, flank pain and hematuria. ; ; Musculoskeletal: Negative for back pain and neck pain. Negative for swelling and trauma.; ; Skin: Negative for pruritus, rash, abrasions, blisters, bruising and skin lesion.; ; Neuro: Negative for headache, lightheadedness and neck stiffness. Negative for weakness, altered level of consciousness, altered mental status, extremity weakness, paresthesias, involuntary movement, seizure and syncope.       Physical Exam Updated Vital Signs BP (!) 144/89 (BP Location: Left Arm)    Pulse 96    Temp 99.7 F (37.6 C) (Oral)    Resp 18    Ht 5\' 11"  (1.803 m)    Wt 89.8 kg    SpO2 98%    BMI 27.62 kg/m   Physical Exam 1050: Physical examination:  Nursing notes reviewed; Vital signs and O2 SAT reviewed;  Constitutional: Well developed, Well nourished, Well hydrated, In no acute distress; Head:  Normocephalic, atraumatic; Eyes: EOMI, PERRL, No scleral icterus; ENMT: Mouth and pharynx normal, Mucous membranes moist; Neck: Supple, Full range of motion, No lymphadenopathy; Cardiovascular: Regular rate and rhythm, No gallop; Respiratory: Breath sounds clear & equal bilaterally, No wheezes.  Speaking full sentences with ease, Normal respiratory effort/excursion; Chest: Nontender, Movement normal; Abdomen: Soft, +mild RUQ, mid-epigastric, LUQ tenderness to palp. No rebound or guarding. Nondistended, Normal bowel sounds; Genitourinary: No CVA tenderness; Spine:  No midline CS, TS, LS tenderness.;;  Extremities: Peripheral pulses normal, No tenderness, No edema, No calf edema or asymmetry.; Neuro: AA&Ox3, Major CN grossly intact.  Speech clear. No gross focal motor or sensory deficits in extremities.; Skin: Color normal, Warm, Dry.   ED Treatments / Results  Labs (all labs ordered are listed, but only abnormal results are  displayed)   EKG EKG Interpretation  Date/Time:  Thursday February 16 2019 10:58:22 EDT Ventricular Rate:  96 PR Interval:    QRS Duration: 90 QT Interval:  332 QTC Calculation: 420 R Axis:   50 Text Interpretation:  Sinus rhythm Abnormal R-wave progression, early transition When compared with ECG of 08/14/2016, 08/15/2007 No significant change was found Confirmed by Francine Graven [phone]) on 02/16/2019 11:06:18 AM   Radiology   Procedures Procedures (including critical care time)  Medications Ordered in ED Medications - No data to display   Initial Impression / Assessment and Plan / ED Course  I have reviewed the triage vital signs and the nursing notes.  Pertinent labs & imaging results that were available during my care of the patient were reviewed by me and considered in my medical decision making (see chart for details).     MDM Reviewed: previous chart, nursing note and vitals Reviewed previous: labs and ECG Interpretation: labs, ECG, x-ray and CT scan  Results for orders placed or performed during the hospital encounter of 02/16/19  Culture, blood (routine x 2)   Specimen: BLOOD RIGHT ARM  Result Value Ref Range   Specimen Description BLOOD RIGHT ARM    Special Requests      BOTTLES DRAWN AEROBIC AND ANAEROBIC Blood Culture adequate volume Performed at [facility], [address], [place], [place] [address]    Culture PENDING    Report Status PENDING   Culture, blood (routine x 2)   Specimen: BLOOD RIGHT HAND  Result Value Ref Range   Specimen Description BLOOD RIGHT HAND    Special Requests      BOTTLES DRAWN AEROBIC AND ANAEROBIC Blood Culture adequate volume Performed at [facility], [address] [place], [place] [address]    Culture PENDING    Report Status PENDING   Comprehensive metabolic panel  Result Value Ref Range   Sodium 138 135 - 145 mmol/L   Potassium 3.9 3.5 - 5.1 mmol/L   Chloride 105 98 - 111 mmol/L   CO2 24 22 - 32 mmol/L    Glucose, Bld 97 70 - 99 mg/dL   BUN 9 8 - 23 mg/dL   Creatinine, Ser 0.91 0.61 - 1.24 mg/dL   Calcium 8.9 8.9 - 10.3 mg/dL   Total Protein 6.8 6.5 - 8.1 g/dL   Albumin 4.3 3.5 - 5.0 g/dL   AST 29 15 - 41 U/L   ALT 34 0 - 44 U/L   Alkaline Phosphatase 41 38 - 126 U/L   Total Bilirubin 1.0 0.3 - 1.2 mg/dL   GFR calc non Af Amer >60 >60 mL/min   GFR calc Af Amer >60 >60 mL/min   Anion gap 9 5 - 15  Lipase, blood  Result Value Ref Range   Lipase 31 11 - 51 U/L  Troponin I (High Sensitivity)  Result Value Ref Range   Troponin I (High Sensitivity) 2.00 <18 ng/L  Troponin I (High Sensitivity)  Result Value Ref Range   Troponin I (High Sensitivity) 3.00 <18 ng/L  CBC with Differential  Result Value Ref Range   WBC 4.5 4.0 - 10.5 K/uL   RBC 4.69 4.22 - 5.81 MIL/uL   Hemoglobin 14.6 13.0 - 17.0 g/dL   HCT 42.0 39.0 - 52.0 %   MCV 89.6 80.0 - 100.0 fL   MCH 31.1 26.0 - 34.0 pg   MCHC 34.8 30.0 - 36.0 g/dL   RDW 13.8 11.5 - 15.5 %   Platelets 192 150 - 400 K/uL   nRBC 0.0 0.0 - 0.2 %   Neutrophils Relative % 81 %   Neutro Abs 3.6 1.7 - 7.7 K/uL   Lymphocytes Relative 8 %   Lymphs Abs 0.4 (L) 0.7 - 4.0 K/uL   Monocytes Relative 8 %   Monocytes Absolute 0.4 0.1 - 1.0 K/uL   Eosinophils Relative 2 %   Eosinophils Absolute 0.1 0.0 - 0.5 K/uL   Basophils Relative 1 %   Basophils Absolute 0.0 0.0 - 0.1 K/uL   Immature Granulocytes 0 %   Abs Immature Granulocytes 0.01 0.00 - 0.07 K/uL  Lactic acid, plasma  Result Value Ref Range   Lactic Acid, Venous 0.7 0.5 - 1.9 mmol/L  Lactic acid, plasma  Result Value Ref Range   Lactic Acid, Venous 0.6 0.5 - 1.9 mmol/L  Urinalysis, Routine w reflex microscopic  Result Value Ref Range   Color, Urine YELLOW YELLOW   APPearance CLEAR CLEAR   Specific Gravity, Urine 1.012  1.005 - 1.030   pH 7.0 5.0 - 8.0   Glucose, UA NEGATIVE NEGATIVE mg/dL   Hgb urine dipstick NEGATIVE NEGATIVE   Bilirubin Urine NEGATIVE NEGATIVE   Ketones, ur NEGATIVE  NEGATIVE mg/dL   Protein, ur NEGATIVE NEGATIVE mg/dL   Nitrite NEGATIVE NEGATIVE   Leukocytes,Ua NEGATIVE NEGATIVE   Dg Chest 2 View Result Date: 02/16/2019 CLINICAL DATA:  Chest and abdominal pain.  History of asthma. EXAM: CHEST - 2 VIEW COMPARISON:  08/14/2016 and prior radiographs FINDINGS: The cardiomediastinal silhouette is unremarkable. Mild chronic peribronchial thickening again noted. There is no evidence of focal airspace disease, pulmonary edema, suspicious pulmonary nodule/mass, pleural effusion, or pneumothorax. No acute bony abnormalities are identified. IMPRESSION: No active cardiopulmonary disease. Electronically Signed   By: Margarette Canada M.D.   On: 02/16/2019 12:20   Ct Abdomen Pelvis W Contrast Result Date: 02/16/2019 CLINICAL DATA:  Upper abdominal pain and decreased appetite for 1 month, history of constipation, GERD, asthma, diverticulitis EXAM: CT ABDOMEN AND PELVIS WITH CONTRAST TECHNIQUE: Multidetector CT imaging of the abdomen and pelvis was performed using the standard protocol following bolus administration of intravenous contrast. Sagittal and coronal MPR images reconstructed from axial data set. CONTRAST:  124mL OMNIPAQUE IOHEXOL 300 MG/ML SOLN IV. No oral contrast. COMPARISON:  08/13/2014 FINDINGS: Lower chest: Lung bases clear Hepatobiliary: Cholelithiasis seen on previous exam less well visualized on current study. Low-attenuation lesion in liver 7 x 8 mm image 21, minimally more prominent, suspect tiny cyst. Remainder of liver unremarkable. Pancreas: Normal appearance Spleen: Normal appearance.  Probable splenule at splenic hilum. Adrenals/Urinary Tract: Adrenal glands normal appearance. Small LEFT renal cysts. Kidneys, ureters, and bladder otherwise normal appearance Stomach/Bowel: Pandiverticulosis of the colon. No CT evidence of acute diverticulitis. Small hiatal hernia. Stomach and bowel loops otherwise normal appearance. Normal appendix. Vascular/Lymphatic: Atherosclerotic  calcifications aorta without aneurysm. No adenopathy. Reproductive: Mild prostatic enlargement gland measuring 3.6 x 5.1 cm. Seminal vesicles unremarkable. Other: No free air or free fluid. Tiny inguinal hernias bilaterally containing fat. Minimal stranding within mesentery question fibrosing mesenteritis, unchanged. No additional inflammatory process. Musculoskeletal: RIGHT hip prosthesis.  No acute osseous findings. IMPRESSION: Chronic changes of fibrosing mesenteritis. Small hiatal hernia. Cholelithiasis. Prostatic enlargement. Diffuse colonic diverticulosis without evidence of diverticulitis. Tiny BILATERAL inguinal hernias containing fat. Electronically Signed   By: Lavonia Dana M.D.   On: 02/16/2019 12:47    [place] was evaluated in Emergency Department on 02/16/2019 for the symptoms described in the history of present illness. He was evaluated in the context of the global COVID-19 pandemic, which necessitated consideration that the patient might be at risk for infection with the SARS-CoV-2 virus that causes COVID-19. Institutional protocols and algorithms that pertain to the evaluation of patients at risk for COVID-19 are in a state of rapid change based on information released by regulatory bodies including the CDC and federal and state organizations. These policies and algorithms were followed during the patient's care in the ED.    1405:  Workup reassuring. BC and UC obtained and are pending. No fever while in the ED. WBC count and lactic acid x2 normal. No clear indication for admission at this time. Dx and testing, d/w pt.  Questions answered.  Verb understanding, agreeable to d/c home with outpt f/u.         Final Clinical Impressions(s) / ED Diagnoses   Final diagnoses:  None    ED Discharge Orders    None       Francine Graven,  DO 02/21/19 1556

## 2019-02-16 NOTE — Discharge Instructions (Addendum)
Take your usual prescriptions as previously directed.  Call your regular medical doctor today to schedule a follow up appointment within the next 2 days.  Return to the Emergency Department immediately sooner if worsening.  ° °

## 2019-02-16 NOTE — Telephone Encounter (Signed)
Jeffrey Mcmahon with Dr. Beverlee Nims office calling to request covid testing for patient. Please contact patient directly to schedule

## 2019-02-16 NOTE — ED Triage Notes (Signed)
Pt c/o of "rising fever" upper abdominal pain and lower back pain x 3 days

## 2019-02-17 ENCOUNTER — Other Ambulatory Visit: Payer: Medicare Other

## 2019-02-17 DIAGNOSIS — R6889 Other general symptoms and signs: Secondary | ICD-10-CM | POA: Diagnosis not present

## 2019-02-17 DIAGNOSIS — Z20822 Contact with and (suspected) exposure to covid-19: Secondary | ICD-10-CM

## 2019-02-17 DIAGNOSIS — J209 Acute bronchitis, unspecified: Secondary | ICD-10-CM | POA: Diagnosis not present

## 2019-02-17 DIAGNOSIS — B349 Viral infection, unspecified: Secondary | ICD-10-CM | POA: Diagnosis not present

## 2019-02-17 LAB — URINE CULTURE: Culture: NO GROWTH

## 2019-02-17 NOTE — Telephone Encounter (Signed)
Contacted pt and he has been scheduled for today at [place] for 12:15. Pt is aware to remain in his car and wear a mask. Pt understood and had no additional questions at this time. Nothing further is needed  Order was placed

## 2019-02-21 LAB — CULTURE, BLOOD (ROUTINE X 2)
Culture: NO GROWTH
Culture: NO GROWTH
Special Requests: ADEQUATE
Special Requests: ADEQUATE

## 2019-02-22 DIAGNOSIS — B349 Viral infection, unspecified: Secondary | ICD-10-CM | POA: Diagnosis not present

## 2019-02-23 LAB — NOVEL CORONAVIRUS, NAA: SARS-CoV-2, NAA: DETECTED — AB

## 2019-02-25 ENCOUNTER — Telehealth: Payer: Medicare Other | Admitting: Physician Assistant

## 2019-02-25 DIAGNOSIS — R110 Nausea: Secondary | ICD-10-CM

## 2019-02-25 DIAGNOSIS — U071 COVID-19: Secondary | ICD-10-CM

## 2019-02-25 MED ORDER — ONDANSETRON 4 MG PO TBDP: 4.0000 mg | ORAL_TABLET | Freq: Three times a day (TID) | ORAL | 0 refills | Status: AC | PRN

## 2019-02-25 NOTE — Progress Notes (Signed)
E-Visit for Corona Virus Screening  I am sorry you are having a rough time. I know you have already been diagnosed with COVID so I need you to continue the instructions given to you at time of diagnosis. Due to the nausea with this, I will send in a medication for nausea that dissolves under the tongue so that we can keep you better hydrated. If you become unable to tolerate any fluids by mouth or anything acutely worsens, you would need to be seen at the ER so IV fluids can be given. I recommend you also schedule a follow-up with your primary care provider this week for reassessment of symptoms.    COVID-19 is a respiratory illness with symptoms that are similar to the flu. Symptoms are typically mild to moderate, but there have been cases of severe illness and death due to the virus. The following symptoms may appear 2-14 days after exposure: . Fever . Cough . Shortness of breath or difficulty breathing . Chills . Repeated shaking with chills . Muscle pain . Headache . Sore throat . New loss of taste or smell . Fatigue . Congestion or runny nose . Nausea or vomiting . Diarrhea  It is vitally important that if you feel that you have an infection such as this virus or any other virus that you stay home and away from places where you may spread it to others.  You should self-quarantine for 14 days if you have symptoms that could potentially be coronavirus or have been in close contact a with a person diagnosed with COVID-19 within the last 2 weeks. You should avoid contact with people age 74 and older.   You should wear a mask or cloth face covering over your nose and mouth if you must be around other people or animals, including pets (even at home). Try to stay at least 6 feet away from other people. This will protect the people around you.  You may also take acetaminophen (Tylenol) as needed for fever.   Reduce your risk of any infection by using the same precautions used for avoiding the  common cold or flu:  Marland Kitchen Wash your hands often with soap and warm water for at least 20 seconds.  If soap and water are not readily available, use an alcohol-based hand sanitizer with at least 60% alcohol.  . If coughing or sneezing, cover your mouth and nose by coughing or sneezing into the elbow areas of your shirt or coat, into a tissue or into your sleeve (not your hands). . Avoid shaking hands with others and consider head nods or verbal greetings only. . Avoid touching your eyes, nose, or mouth with unwashed hands.  . Avoid close contact with people who are sick. . Avoid places or events with large numbers of people in one location, like concerts or sporting events. . Carefully consider travel plans you have or are making. . If you are planning any travel outside or inside the [place], visit the CDC's Travelers' Health webpage for the latest health notices. . If you have some symptoms but not all symptoms, continue to monitor at home and seek medical attention if your symptoms worsen. . If you are having a medical emergency, call 911.  HOME CARE . Only take medications as instructed by your medical team. . Drink plenty of fluids and get plenty of rest. . A steam or ultrasonic humidifier can help if you have congestion.   GET HELP RIGHT AWAY IF YOU HAVE EMERGENCY WARNING  SIGNS** FOR COVID-19. If you or someone is showing any of these signs seek emergency medical care immediately. Call 911 or proceed to your closest emergency facility if: . You develop worsening high fever. . Trouble breathing . Bluish lips or face . Persistent pain or pressure in the chest . New confusion . Inability to wake or stay awake . You cough up blood. . Your symptoms become more severe  **This list is not all possible symptoms. Contact your medical provider for any symptoms that are sever or concerning to you.   MAKE SURE YOU   Understand these instructions.  Will watch your condition.  Will get help right  away if you are not doing well or get worse.  Your e-visit answers were reviewed by a board certified advanced clinical practitioner to complete your personal care plan.  Depending on the condition, your plan could have included both over the counter or prescription medications.  If there is a problem please reply once you have received a response from your provider.  Your safety is important to [place].  If you have drug allergies check your prescription carefully.    You can use MyChart to ask questions about today's visit, request a non-urgent call back, or ask for a work or school excuse for 24 hours related to this e-Visit. If it has been greater than 24 hours you will need to follow up with your provider, or enter a new e-Visit to address those concerns. You will get an e-mail in the next two days asking about your experience.  I hope that your e-visit has been valuable and will speed your recovery. Thank you for using e-visits.

## 2019-02-25 NOTE — Progress Notes (Signed)
I have spent 5 minutes in review of e-visit questionnaire, review and updating patient chart, medical decision making and response to patient.   Isaura Schiller Cody Alaijah Gibler, PA-C    

## 2019-04-12 DIAGNOSIS — J4542 Moderate persistent asthma with status asthmaticus: Secondary | ICD-10-CM | POA: Diagnosis not present

## 2019-04-12 DIAGNOSIS — Z79899 Other long term (current) drug therapy: Secondary | ICD-10-CM | POA: Diagnosis not present

## 2019-04-12 DIAGNOSIS — R7301 Impaired fasting glucose: Secondary | ICD-10-CM | POA: Diagnosis not present

## 2019-04-18 DIAGNOSIS — J4540 Moderate persistent asthma, uncomplicated: Secondary | ICD-10-CM | POA: Diagnosis not present

## 2019-04-18 DIAGNOSIS — Z0001 Encounter for general adult medical examination with abnormal findings: Secondary | ICD-10-CM | POA: Diagnosis not present

## 2019-04-18 DIAGNOSIS — L409 Psoriasis, unspecified: Secondary | ICD-10-CM | POA: Diagnosis not present

## 2019-04-18 DIAGNOSIS — U071 COVID-19: Secondary | ICD-10-CM | POA: Diagnosis not present

## 2019-04-21 DIAGNOSIS — Z23 Encounter for immunization: Secondary | ICD-10-CM | POA: Diagnosis not present

## 2019-05-19 DIAGNOSIS — Z79899 Other long term (current) drug therapy: Secondary | ICD-10-CM | POA: Diagnosis not present

## 2019-05-19 DIAGNOSIS — L400 Psoriasis vulgaris: Secondary | ICD-10-CM | POA: Diagnosis not present

## 2019-05-30 DIAGNOSIS — L400 Psoriasis vulgaris: Secondary | ICD-10-CM | POA: Diagnosis not present

## 2019-08-22 DIAGNOSIS — L400 Psoriasis vulgaris: Secondary | ICD-10-CM | POA: Diagnosis not present

## 2019-08-22 DIAGNOSIS — Z79899 Other long term (current) drug therapy: Secondary | ICD-10-CM | POA: Diagnosis not present

## 2019-08-23 DIAGNOSIS — L400 Psoriasis vulgaris: Secondary | ICD-10-CM | POA: Diagnosis not present

## 2019-10-30 DIAGNOSIS — N3000 Acute cystitis without hematuria: Secondary | ICD-10-CM | POA: Diagnosis not present

## 2019-11-28 DIAGNOSIS — Z79899 Other long term (current) drug therapy: Secondary | ICD-10-CM | POA: Diagnosis not present

## 2019-11-28 DIAGNOSIS — L400 Psoriasis vulgaris: Secondary | ICD-10-CM | POA: Diagnosis not present

## 2019-12-04 DIAGNOSIS — L400 Psoriasis vulgaris: Secondary | ICD-10-CM | POA: Diagnosis not present

## 2020-03-05 DIAGNOSIS — L400 Psoriasis vulgaris: Secondary | ICD-10-CM | POA: Diagnosis not present

## 2020-03-05 DIAGNOSIS — Z79899 Other long term (current) drug therapy: Secondary | ICD-10-CM | POA: Diagnosis not present

## 2020-03-11 DIAGNOSIS — L400 Psoriasis vulgaris: Secondary | ICD-10-CM | POA: Diagnosis not present

## 2020-04-03 ENCOUNTER — Ambulatory Visit: Payer: Medicare Other | Admitting: Dermatology

## 2020-04-12 DIAGNOSIS — L409 Psoriasis, unspecified: Secondary | ICD-10-CM | POA: Diagnosis not present

## 2020-04-12 DIAGNOSIS — Z79899 Other long term (current) drug therapy: Secondary | ICD-10-CM | POA: Diagnosis not present

## 2020-04-12 DIAGNOSIS — J4520 Mild intermittent asthma, uncomplicated: Secondary | ICD-10-CM | POA: Diagnosis not present

## 2020-04-18 DIAGNOSIS — Z0001 Encounter for general adult medical examination with abnormal findings: Secondary | ICD-10-CM | POA: Diagnosis not present

## 2020-04-18 DIAGNOSIS — Z0000 Encounter for general adult medical examination without abnormal findings: Secondary | ICD-10-CM | POA: Diagnosis not present

## 2020-04-18 DIAGNOSIS — L409 Psoriasis, unspecified: Secondary | ICD-10-CM | POA: Diagnosis not present

## 2020-04-18 DIAGNOSIS — J4520 Mild intermittent asthma, uncomplicated: Secondary | ICD-10-CM | POA: Diagnosis not present

## 2020-04-18 DIAGNOSIS — R7301 Impaired fasting glucose: Secondary | ICD-10-CM | POA: Diagnosis not present

## 2020-05-01 DIAGNOSIS — Z23 Encounter for immunization: Secondary | ICD-10-CM | POA: Diagnosis not present

## 2020-06-03 DIAGNOSIS — Z03818 Encounter for observation for suspected exposure to other biological agents ruled out: Secondary | ICD-10-CM | POA: Diagnosis not present

## 2020-06-03 DIAGNOSIS — Z20822 Contact with and (suspected) exposure to covid-19: Secondary | ICD-10-CM | POA: Diagnosis not present

## 2020-06-06 DIAGNOSIS — R051 Acute cough: Secondary | ICD-10-CM | POA: Diagnosis not present

## 2020-06-07 DIAGNOSIS — L400 Psoriasis vulgaris: Secondary | ICD-10-CM | POA: Diagnosis not present

## 2020-06-07 DIAGNOSIS — Z79899 Other long term (current) drug therapy: Secondary | ICD-10-CM | POA: Diagnosis not present

## 2020-06-11 DIAGNOSIS — L400 Psoriasis vulgaris: Secondary | ICD-10-CM | POA: Diagnosis not present

## 2020-07-03 DIAGNOSIS — Z20822 Contact with and (suspected) exposure to covid-19: Secondary | ICD-10-CM | POA: Diagnosis not present

## 2020-07-20 DIAGNOSIS — Z20822 Contact with and (suspected) exposure to covid-19: Secondary | ICD-10-CM | POA: Diagnosis not present

## 2020-09-09 DIAGNOSIS — U071 COVID-19: Secondary | ICD-10-CM | POA: Diagnosis not present

## 2020-09-13 DIAGNOSIS — Z8616 Personal history of COVID-19: Secondary | ICD-10-CM | POA: Diagnosis not present

## 2020-09-13 DIAGNOSIS — Z79899 Other long term (current) drug therapy: Secondary | ICD-10-CM | POA: Diagnosis not present

## 2020-09-13 DIAGNOSIS — L400 Psoriasis vulgaris: Secondary | ICD-10-CM | POA: Diagnosis not present

## 2020-09-18 DIAGNOSIS — L400 Psoriasis vulgaris: Secondary | ICD-10-CM | POA: Diagnosis not present

## 2020-09-27 DIAGNOSIS — Z8616 Personal history of COVID-19: Secondary | ICD-10-CM | POA: Diagnosis not present

## 2020-09-27 DIAGNOSIS — Z7689 Persons encountering health services in other specified circumstances: Secondary | ICD-10-CM | POA: Diagnosis not present

## 2020-11-15 DIAGNOSIS — J3489 Other specified disorders of nose and nasal sinuses: Secondary | ICD-10-CM | POA: Diagnosis not present

## 2020-11-15 DIAGNOSIS — J343 Hypertrophy of nasal turbinates: Secondary | ICD-10-CM | POA: Diagnosis not present

## 2020-11-15 DIAGNOSIS — R490 Dysphonia: Secondary | ICD-10-CM | POA: Insufficient documentation

## 2020-11-15 DIAGNOSIS — J384 Edema of larynx: Secondary | ICD-10-CM | POA: Diagnosis not present

## 2020-11-15 DIAGNOSIS — T7840XA Allergy, unspecified, initial encounter: Secondary | ICD-10-CM | POA: Insufficient documentation

## 2021-01-13 DIAGNOSIS — L400 Psoriasis vulgaris: Secondary | ICD-10-CM | POA: Diagnosis not present

## 2021-01-13 DIAGNOSIS — Z79899 Other long term (current) drug therapy: Secondary | ICD-10-CM | POA: Diagnosis not present

## 2021-01-14 DIAGNOSIS — L400 Psoriasis vulgaris: Secondary | ICD-10-CM | POA: Diagnosis not present

## 2021-02-24 IMAGING — CT CT ABDOMEN AND PELVIS WITH CONTRAST
2 of 5 series · 15 of 46 positions shown, 17 images · IV contrast (Isovue)
Comparison: 08/13/2014

CLINICAL DATA: Upper abdominal pain and decreased appetite for 1
month, history of constipation, GERD, asthma, diverticulitis

EXAM:
CT ABDOMEN AND PELVIS WITH CONTRAST
TECHNIQUE: Multidetector CT imaging of the abdomen and pelvis was performed
using the standard protocol following bolus administration of
intravenous contrast. Sagittal and coronal MPR images reconstructed
from axial data set.
CONTRAST:  100mL OMNIPAQUE IOHEXOL 300 MG/ML SOLN IV. No oral
contrast.

[Series 3: axial st · axial · 0.91mm/px · z∈[+767,+1202]mm · 12 of 103 slices shown, 14 images]
[im 8/103  soft-tissue]
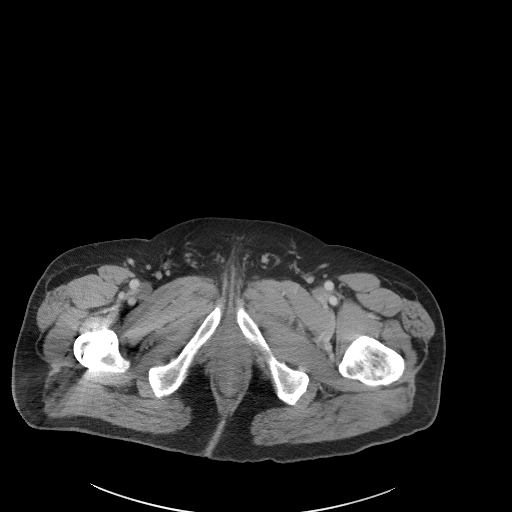
[im 8/103  bone]
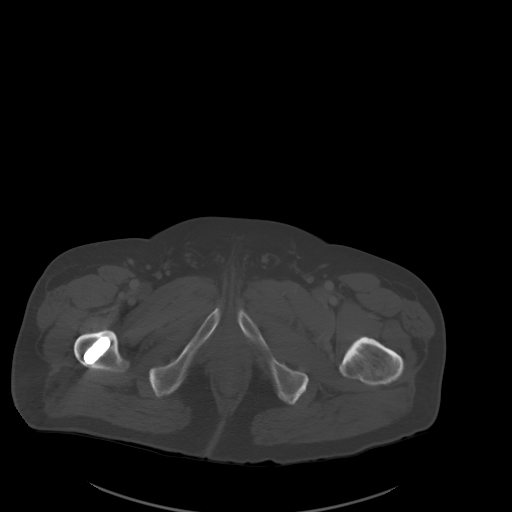
[im 16/103  soft-tissue]
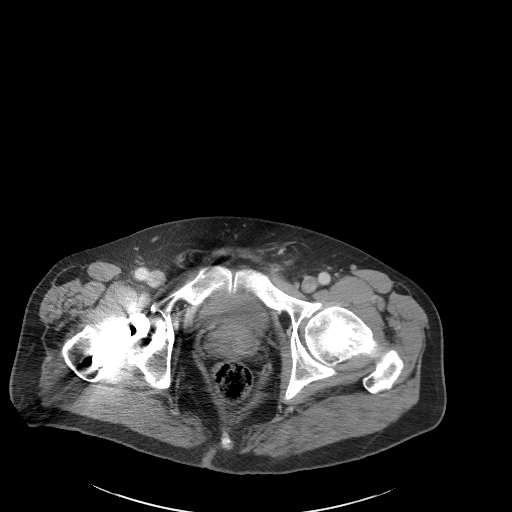
[im 24/103  soft-tissue]
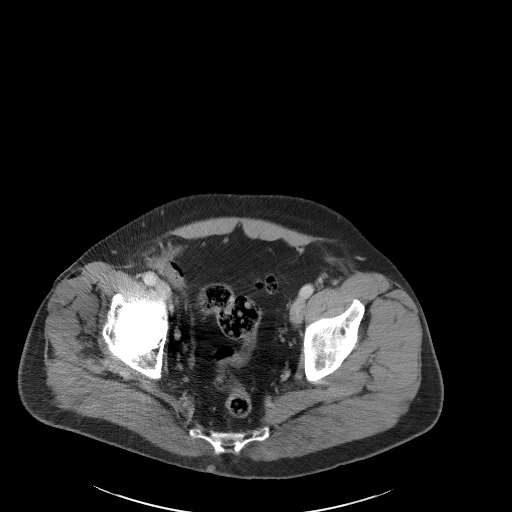
[im 32/103  soft-tissue]
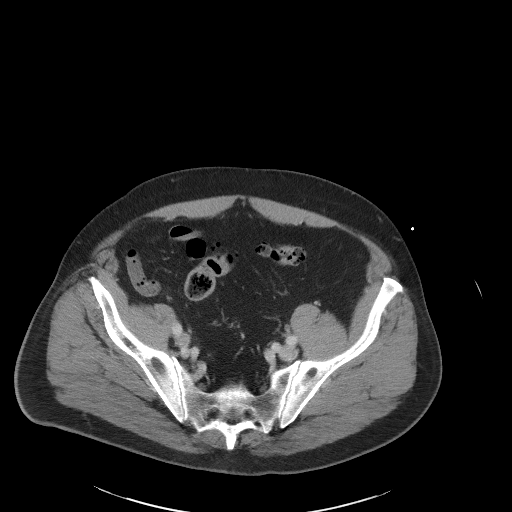
[im 40/103  soft-tissue]
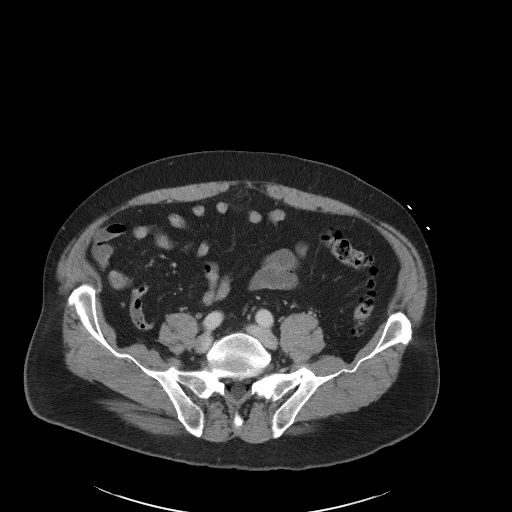
[im 48/103  soft-tissue]
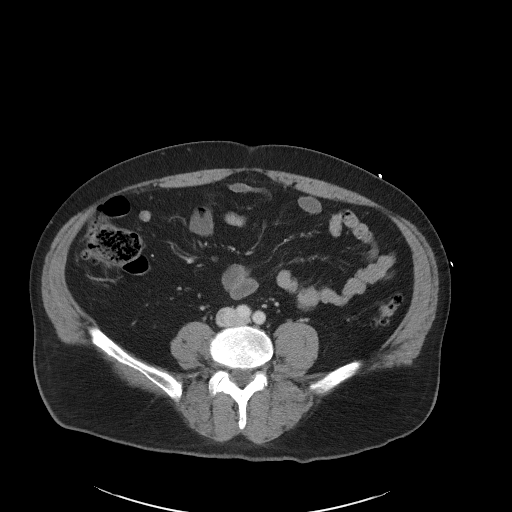
[im 55/103  soft-tissue]
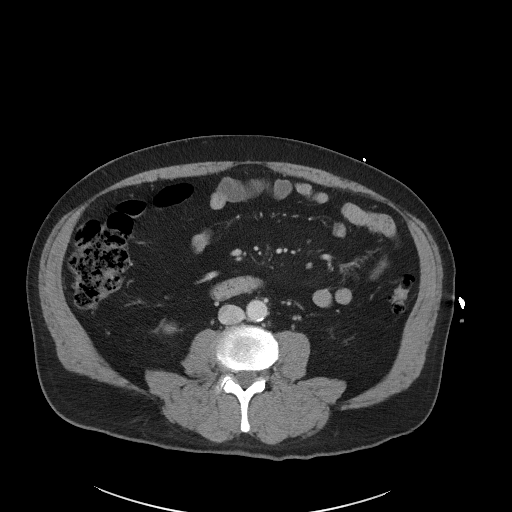
[im 63/103  soft-tissue]
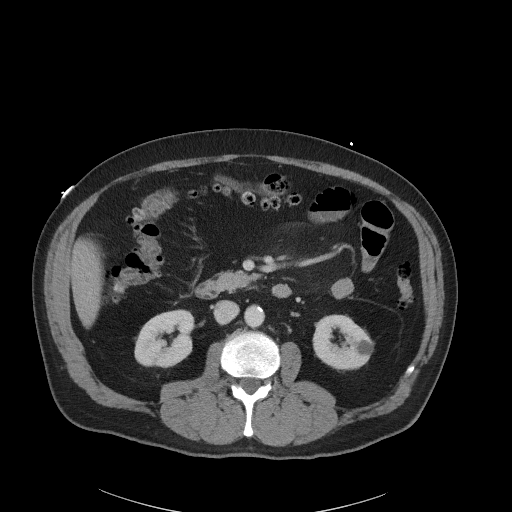
[im 71/103  soft-tissue]
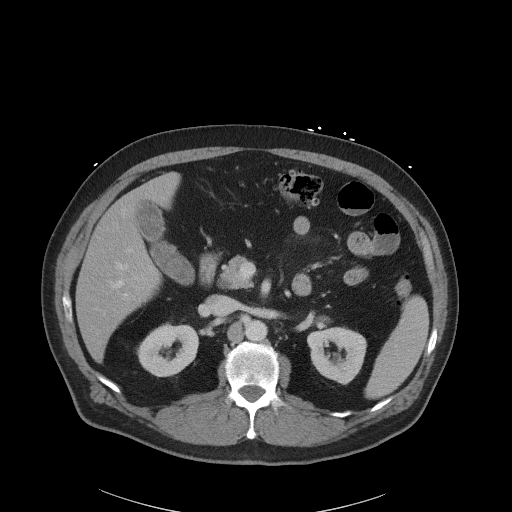
[im 71/103  bone]
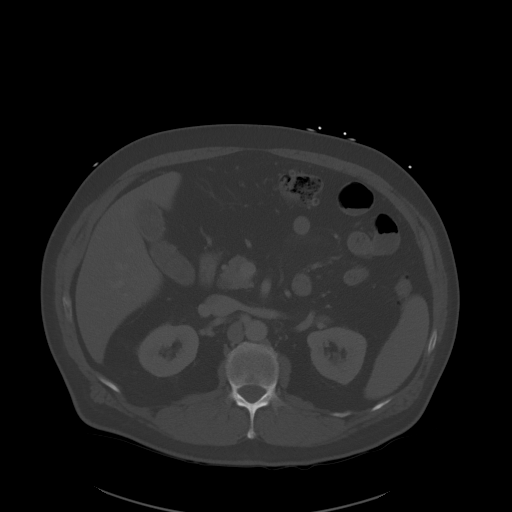
[im 79/103  soft-tissue]
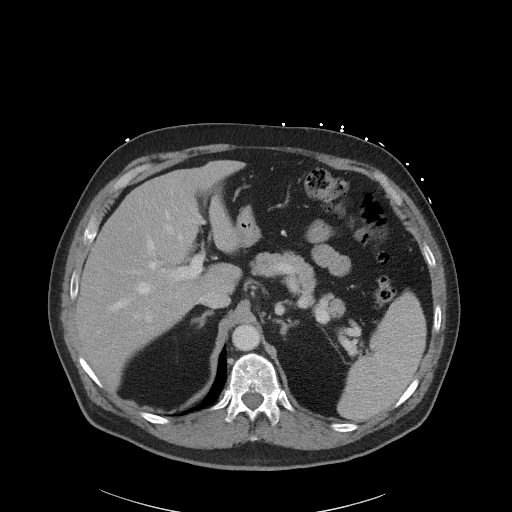
[im 87/103  soft-tissue]
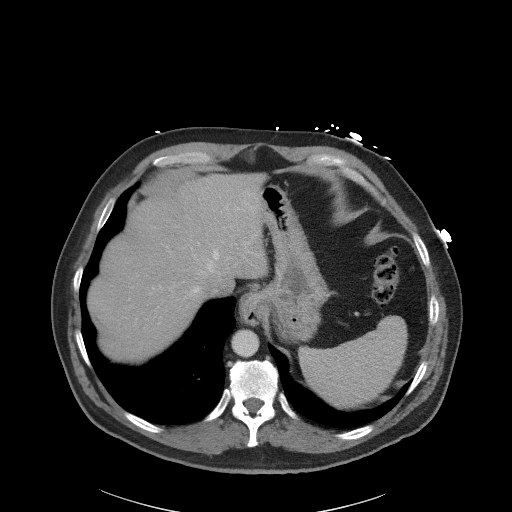
[im 95/103  soft-tissue]
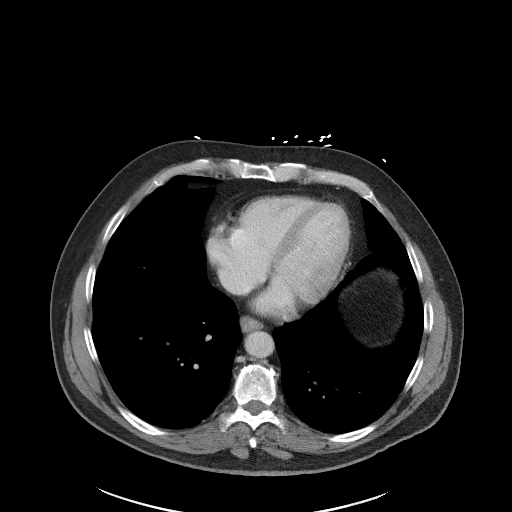

[Series 7: coronal st · coronal · 0.70mm/px · 3 of 117 slices shown]
[im 39/117  soft-tissue]
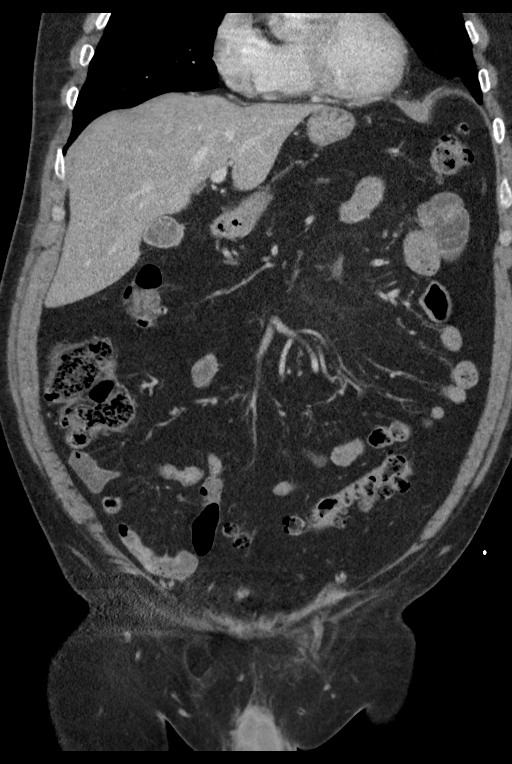
[im 52/117  soft-tissue]
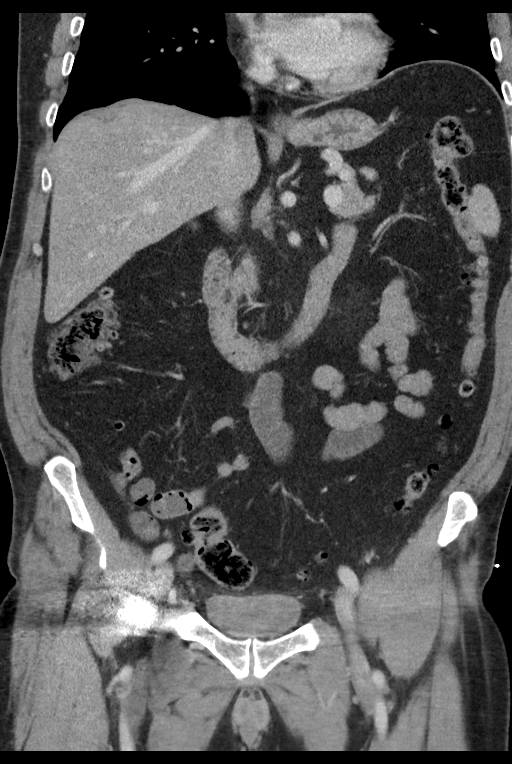
[im 65/117  soft-tissue]
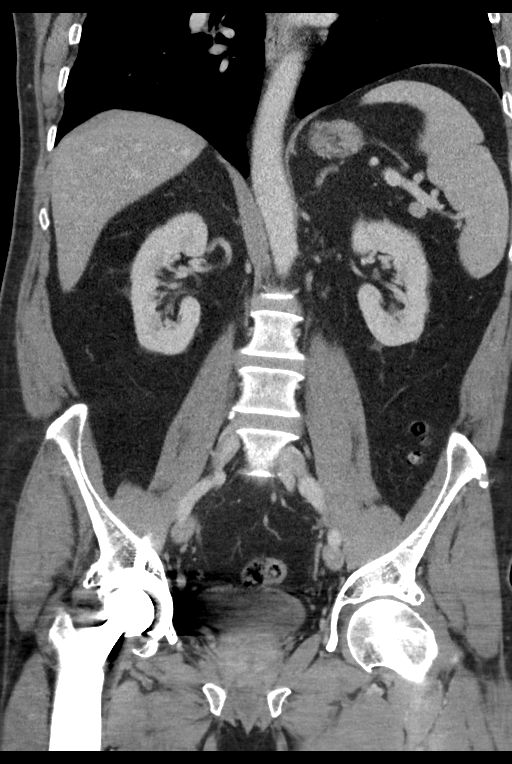

[15 of 46 positions shown; findings below may reference images not displayed]

FINDINGS: Lower chest: Lung bases clear

Hepatobiliary: Cholelithiasis seen on previous exam less well
visualized on current study. Low-attenuation lesion in liver 7 x 8
mm image 21, minimally more prominent, suspect tiny cyst. Remainder
of liver unremarkable.

Pancreas: Normal appearance

Spleen: Normal appearance.  Probable splenule at splenic hilum.

Adrenals/Urinary Tract: Adrenal glands normal appearance. Small LEFT
renal cysts. Kidneys, ureters, and bladder otherwise normal
appearance

Stomach/Bowel: Pandiverticulosis of the colon. No CT evidence of
acute diverticulitis. Small hiatal hernia. Stomach and bowel loops
otherwise normal appearance. Normal appendix.

Vascular/Lymphatic: Atherosclerotic calcifications aorta without
aneurysm. No adenopathy.

Reproductive: Mild prostatic enlargement gland measuring 3.6 x
cm. Seminal vesicles unremarkable.

Other: No free air or free fluid. Tiny inguinal hernias bilaterally
containing fat. Minimal stranding within mesentery question
fibrosing mesenteritis, unchanged. No additional inflammatory
process.

Musculoskeletal: RIGHT hip prosthesis.  No acute osseous findings.
IMPRESSION: Chronic changes of fibrosing mesenteritis.

Small hiatal hernia.

Cholelithiasis.

Prostatic enlargement.

Diffuse colonic diverticulosis without evidence of diverticulitis.

Tiny BILATERAL inguinal hernias containing fat.

## 2021-03-04 ENCOUNTER — Ambulatory Visit: Payer: Medicare Other | Admitting: Orthopaedic Surgery

## 2021-03-04 ENCOUNTER — Encounter: Payer: Self-pay | Admitting: Orthopaedic Surgery

## 2021-03-04 ENCOUNTER — Ambulatory Visit: Payer: Medicare Other

## 2021-03-04 ENCOUNTER — Other Ambulatory Visit: Payer: Self-pay

## 2021-03-04 VITALS — BP 147/86 | HR 69 | Ht 71.0 in | Wt 212.0 lb

## 2021-03-04 DIAGNOSIS — M25561 Pain in right knee: Secondary | ICD-10-CM

## 2021-03-04 DIAGNOSIS — G8929 Other chronic pain: Secondary | ICD-10-CM

## 2021-03-04 NOTE — Progress Notes (Signed)
Subjective:    Patient ID: Jeffrey Mcmahon, male    DOB: [birth year], 70 y.o.   MRN: [id number]  HPI He cleaned out his basement on July 4th weekend and has had pain in the right knee since then.  He had no fall but says he overdid it.  He has deep pain that is worse with walking.  He has slight swelling but no giving way.  It pops.  He has tried Advil which helped only a little.  He is tired of it hurting.  He is not sleeping well.  He has no redness, no other joint pains.   Review of Systems  Constitutional:  Positive for activity change.  Respiratory:  Positive for shortness of breath.   Musculoskeletal:  Positive for arthralgias, gait problem and joint swelling.  Skin:        He has psoriasis and rash around the anterior right knee.  All other systems reviewed and are negative. For Review of Systems, all other systems reviewed and are negative.  The following is a summary of the past history medically, past history surgically, known current medicines, social history and family history.  This information is gathered electronically by the computer from prior information and documentation.  I review this each visit and have found including this information at this point in the chart is beneficial and informative.   Past Medical History:  Diagnosis Date   Arthritis    Asthma    Colon polyps    Constipation    Diverticulitis    GERD (gastroesophageal reflux disease)    occasional   Pneumonia    December 2012   Pneumothorax    Psoriasis     Past Surgical History:  Procedure Laterality Date   COLONOSCOPY  07/29/2011   Procedure: COLONOSCOPY;  Surgeon: Rogene Houston, MD;  [facility];  Service: Endoscopy;  Laterality: N/A;  7:30 am   COLONOSCOPY N/A 06/09/2017   Procedure: COLONOSCOPY;  Surgeon: Rogene Houston, MD;  [facility];  Service: Endoscopy;  Laterality: N/A;  145   COLONOSCOPY N/A 09/08/2018   Procedure: COLONOSCOPY;  Surgeon: Rogene Houston,  MD;  [facility];  Service: Endoscopy;  Laterality: N/A;  [place]   POLYPECTOMY  09/08/2018   Procedure: POLYPECTOMY;  Surgeon: Rogene Houston, MD;  [facility];  Service: Endoscopy;;   Right hip surgery  1993   Secondary to a staph infection   TOTAL HIP ARTHROPLASTY Right 03/14/2013   Procedure: RIGHT TOTAL HIP ARTHROPLASTY ANTERIOR APPROACH;  Surgeon: Mauri Pole, MD;  Location: [facility];  Service: Orthopedics;  Laterality: Right;    Current Outpatient Medications on File Prior to Visit  Medication Sig Dispense Refill   albuterol (PROVENTIL HFA;VENTOLIN HFA) 108 (90 BASE) MCG/ACT inhaler Inhale 1-2 puffs into the lungs every 6 (six) hours as needed. For shortness of breath      cetirizine (ZYRTEC) 10 MG tablet Take 10 mg by mouth daily as needed for allergies.     Fluticasone-Salmeterol (ADVAIR) 250-50 MCG/DOSE AEPB Inhale 1 puff into the lungs every other day.     ibuprofen (ADVIL,MOTRIN) 200 MG tablet Take 400-800 mg by mouth daily as needed for headache or moderate pain.  30 tablet 0   methotrexate 2.5 MG tablet Take 10 mg by mouth every Friday.  1   ondansetron (ZOFRAN ODT) 4 MG disintegrating tablet Take 1 tablet (4 mg total) by mouth  every 8 (eight) hours as needed for nausea or vomiting. 20 tablet 0   No current facility-administered medications on file prior to visit.    Social History   Socioeconomic History   Marital status: Married    Spouse name: Not on file   Number of children: Not on file   Years of education: Not on file   Highest education level: Not on file  Occupational History   Not on file  Tobacco Use   Smoking status: Never   Smokeless tobacco: Never  Substance and Sexual Activity   Alcohol use: No   Drug use: No   Sexual activity: Yes  Other Topics Concern   Not on file  Social History Narrative   Not on file   Social Determinants of Health   Financial Resource Strain: Not on file  Food Insecurity: Not  on file  Transportation Needs: Not on file  Physical Activity: Not on file  Stress: Not on file  Social Connections: Not on file  Intimate Partner Violence: Not on file    Family History  Problem Relation Age of Onset   Diabetes Father    Heart attack Father    Colon cancer Mother     BP (!) 147/86   Pulse 69   Ht '5\' 11"'$  (1.803 m)   Wt 212 lb (96.2 kg)   BMI 29.57 kg/m   Body mass index is 29.57 kg/m.     Objective:   Physical Exam Vitals and nursing note reviewed. Exam conducted with a chaperone present.  Constitutional:      Appearance: He is well-developed.  HENT:     Head: Normocephalic and atraumatic.  Eyes:     Conjunctiva/sclera: Conjunctivae normal.     Pupils: Pupils are equal, round, and reactive to light.  Cardiovascular:     Rate and Rhythm: Normal rate and regular rhythm.  Pulmonary:     Effort: Pulmonary effort is normal.  Abdominal:     Palpations: Abdomen is soft.  Musculoskeletal:     Cervical back: Normal range of motion and neck supple.       Legs:  Skin:    General: Skin is warm and dry.  Neurological:     Mental Status: He is alert and oriented to person, place, and time.     Cranial Nerves: No cranial nerve deficit.     Motor: No abnormal muscle tone.     Coordination: Coordination normal.     Deep Tendon Reflexes: Reflexes are normal and symmetric. Reflexes normal.  Psychiatric:        Behavior: Behavior normal.        Thought Content: Thought content normal.        Judgment: Judgment normal.  X-rays were done of the right knee, reported separately.        Assessment & Plan:   Encounter Diagnosis  Name Primary?   Chronic pain of right knee Yes   I am concerned about possible meniscus tear.  PROCEDURE NOTE:  The patient requests injections of the right knee , verbal consent was obtained.  The right knee was prepped appropriately after time out was performed.   Sterile technique was observed and injection of 1 cc of  Celestone '6mg'$  with several cc's of plain xylocaine. Anesthesia was provided by ethyl chloride and a 20-gauge needle was used to inject the knee area. The injection was tolerated well.  A band aid dressing was applied.  The patient was advised to apply ice later  today and tomorrow to the injection sight as needed.   Return in three weeks.  He may need MRI.  Call if any problem.  Precautions discussed.  Electronically Signed Sanjuana Kava, MD 7/26/20229:19 AM

## 2021-03-25 ENCOUNTER — Ambulatory Visit: Payer: Medicare Other | Admitting: Orthopaedic Surgery

## 2021-04-11 DIAGNOSIS — Z79899 Other long term (current) drug therapy: Secondary | ICD-10-CM | POA: Diagnosis not present

## 2021-04-11 DIAGNOSIS — L400 Psoriasis vulgaris: Secondary | ICD-10-CM | POA: Diagnosis not present

## 2021-04-15 DIAGNOSIS — L400 Psoriasis vulgaris: Secondary | ICD-10-CM | POA: Diagnosis not present

## 2021-04-15 DIAGNOSIS — X32XXXD Exposure to sunlight, subsequent encounter: Secondary | ICD-10-CM | POA: Diagnosis not present

## 2021-04-15 DIAGNOSIS — L570 Actinic keratosis: Secondary | ICD-10-CM | POA: Diagnosis not present

## 2021-05-16 DIAGNOSIS — R7301 Impaired fasting glucose: Secondary | ICD-10-CM | POA: Diagnosis not present

## 2021-05-16 DIAGNOSIS — L409 Psoriasis, unspecified: Secondary | ICD-10-CM | POA: Diagnosis not present

## 2021-05-16 DIAGNOSIS — Z79899 Other long term (current) drug therapy: Secondary | ICD-10-CM | POA: Diagnosis not present

## 2021-05-16 DIAGNOSIS — J45909 Unspecified asthma, uncomplicated: Secondary | ICD-10-CM | POA: Diagnosis not present

## 2021-05-23 DIAGNOSIS — J4520 Mild intermittent asthma, uncomplicated: Secondary | ICD-10-CM | POA: Diagnosis not present

## 2021-05-23 DIAGNOSIS — Z0001 Encounter for general adult medical examination with abnormal findings: Secondary | ICD-10-CM | POA: Diagnosis not present

## 2021-05-23 DIAGNOSIS — Z23 Encounter for immunization: Secondary | ICD-10-CM | POA: Diagnosis not present

## 2021-05-23 DIAGNOSIS — R7301 Impaired fasting glucose: Secondary | ICD-10-CM | POA: Diagnosis not present

## 2021-06-16 DIAGNOSIS — Z96641 Presence of right artificial hip joint: Secondary | ICD-10-CM | POA: Diagnosis not present

## 2021-06-16 DIAGNOSIS — M25561 Pain in right knee: Secondary | ICD-10-CM | POA: Diagnosis not present

## 2021-07-15 DIAGNOSIS — L400 Psoriasis vulgaris: Secondary | ICD-10-CM | POA: Diagnosis not present

## 2021-07-15 DIAGNOSIS — Z79899 Other long term (current) drug therapy: Secondary | ICD-10-CM | POA: Diagnosis not present

## 2021-07-18 DIAGNOSIS — K5792 Diverticulitis of intestine, part unspecified, without perforation or abscess without bleeding: Secondary | ICD-10-CM | POA: Diagnosis not present

## 2021-07-22 DIAGNOSIS — L400 Psoriasis vulgaris: Secondary | ICD-10-CM | POA: Diagnosis not present

## 2021-09-07 DIAGNOSIS — J45998 Other asthma: Secondary | ICD-10-CM | POA: Diagnosis not present

## 2021-09-09 ENCOUNTER — Other Ambulatory Visit: Payer: Self-pay

## 2021-09-09 ENCOUNTER — Other Ambulatory Visit (HOSPITAL_COMMUNITY): Payer: Self-pay | Admitting: Internal Medicine

## 2021-09-09 ENCOUNTER — Ambulatory Visit (HOSPITAL_COMMUNITY)
Admission: RE | Admit: 2021-09-09 | Discharge: 2021-09-09 | Disposition: A | Discharge status: Home / Self Care | Payer: Medicare Other | Source: Ambulatory Visit | Attending: Internal Medicine | Admitting: Internal Medicine

## 2021-09-09 DIAGNOSIS — R079 Chest pain, unspecified: Secondary | ICD-10-CM | POA: Insufficient documentation

## 2021-10-03 ENCOUNTER — Other Ambulatory Visit: Payer: Self-pay

## 2021-10-03 ENCOUNTER — Ambulatory Visit: Payer: Medicare Other | Admitting: Orthopedic Surgery

## 2021-10-03 ENCOUNTER — Ambulatory Visit: Payer: Medicare Other

## 2021-10-03 ENCOUNTER — Encounter: Payer: Self-pay | Admitting: Orthopedic Surgery

## 2021-10-03 VITALS — BP 123/81 | HR 75 | Ht 71.0 in | Wt 207.4 lb

## 2021-10-03 DIAGNOSIS — M25512 Pain in left shoulder: Secondary | ICD-10-CM

## 2021-10-03 NOTE — Patient Instructions (Addendum)

## 2021-10-03 NOTE — Progress Notes (Signed)
New Patient Visit  Assessment: Jeffrey Mcmahon CURRENT is a 71 y.o. male with the following: 1. Acute pain of left shoulder  Plan: Acute onset of pain, atraumatic onset.  He has good range of motion.  Strength is good.  Radiographs without obvious pathology.  He has previously had a good response to steroid injection.  He would like to proceed with another steroid injection today.  Home exercise program provided.  Follow-up as needed.  Procedure note injection Left shoulder    Verbal consent was obtained to inject the left shoulder, subacromial space Timeout was completed to confirm the site of injection.  The skin was prepped with alcohol and ethyl chloride was sprayed at the injection site.  A 21-gauge needle was used to inject 40 mg of Depo-Medrol and 1% lidocaine (3 cc) into the subacromial space of the left shoulder using a posterolateral approach.  There were no complications. A sterile bandage was applied.    Follow-up: Return if symptoms worsen or fail to improve.  Subjective:  Chief Complaint  Patient presents with   New Patient (Initial Visit)    Previously seen Dr. Luna Glasgow LT shoulder// painful x 2 weeks. No known injury    History of Present Illness: Jeffrey Mcmahon is a 71 y.o. male who presents to clinic today for evaluation of left shoulder pain.  He states that pain in the anterior and superior aspect of the left shoulder for the past 2 weeks.  No specific injury.  He states he woke up, started having pain in his left shoulder.  He has been taking Tylenol and some ibuprofen as needed.  He states that ibuprofen has been helpful.  He has had injections in the past which provided excellent relief.  Pain does not get worse at night.  He is able to complete most activities above the level of the shoulder.  No numbness or tingling.   Review of Systems: No fevers or chills No numbness or tingling No chest pain No shortness of breath No bowel or bladder dysfunction No GI distress No  headaches   Medical History:  Past Medical History:  Diagnosis Date   Arthritis    Asthma    Colon polyps    Constipation    Diverticulitis    GERD (gastroesophageal reflux disease)    occasional   Pneumonia    December 2012   Pneumothorax    Psoriasis     Past Surgical History:  Procedure Laterality Date   COLONOSCOPY  07/29/2011   Procedure: COLONOSCOPY;  Surgeon: Rogene Houston, MD;  [facility];  Service: Endoscopy;  Laterality: N/A;  7:30 am   COLONOSCOPY N/A 06/09/2017   Procedure: COLONOSCOPY;  Surgeon: Rogene Houston, MD;  [facility];  Service: Endoscopy;  Laterality: N/A;  145   COLONOSCOPY N/A 09/08/2018   Procedure: COLONOSCOPY;  Surgeon: Rogene Houston, MD;  [facility];  Service: Endoscopy;  Laterality: N/A;  [place]   POLYPECTOMY  09/08/2018   Procedure: POLYPECTOMY;  Surgeon: Rogene Houston, MD;  [facility];  Service: Endoscopy;;   Right hip surgery  1993   Secondary to a staph infection   TOTAL HIP ARTHROPLASTY Right 03/14/2013   Procedure: RIGHT TOTAL HIP ARTHROPLASTY ANTERIOR APPROACH;  Surgeon: Mauri Pole, MD;  Location: [facility];  Service: Orthopedics;  Laterality: Right;    Family History  Problem Relation Age of Onset   Diabetes Father  Heart attack Father    Colon cancer Mother    Social History   Tobacco Use   Smoking status: Never   Smokeless tobacco: Never  Substance Use Topics   Alcohol use: No   Drug use: No    No Known Allergies  Current Meds  Medication Sig   albuterol (PROVENTIL HFA;VENTOLIN HFA) 108 (90 BASE) MCG/ACT inhaler Inhale 1-2 puffs into the lungs every 6 (six) hours as needed. For shortness of breath    cetirizine (ZYRTEC) 10 MG tablet Take 10 mg by mouth daily as needed for allergies.   Fluticasone-Salmeterol (ADVAIR) 250-50 MCG/DOSE AEPB Inhale 1 puff into the lungs every other day.   ibuprofen (ADVIL,MOTRIN) 200 MG tablet Take 400-800 mg  by mouth daily as needed for headache or moderate pain.    methotrexate 2.5 MG tablet Take 10 mg by mouth every Friday.   ondansetron (ZOFRAN ODT) 4 MG disintegrating tablet Take 1 tablet (4 mg total) by mouth every 8 (eight) hours as needed for nausea or vomiting.    Objective: BP 123/81    Pulse 75    Ht 5\' 11"  (1.803 m)    Wt 207 lb 7.2 oz (94.1 kg)    BMI 28.93 kg/m   Physical Exam:  General: Alert and oriented. and No acute distress. Gait: Normal gait.  Left shoulder without deformity.  No atrophy is appreciated.  170 degrees of forward flexion.  Abduction to 90 degrees.  Internal rotation to his lumbar spine.  External rotation at his side to 45 degrees.  Strength is 5/5.  Mild discomfort with empty can testing position.  Tenderness to palpation over the bicipital groove.  Fingers are warm and well-perfused.   IMAGING: I personally ordered and reviewed the following images  X-rays of the left shoulder were obtained in clinic today.  No acute injuries are noted.  Overall bone quality is good.  No evidence of proximal humeral migration.  Mild narrowing of the glenohumeral joint.  No osteophytes are appreciated.  Impression: Left shoulder x-rays with mild glenohumeral degenerative changes.  Negative otherwise.   New Medications:  No orders of the defined types were placed in this encounter.     Mordecai Rasmussen, MD  10/03/2021 9:59 PM

## 2021-10-20 DIAGNOSIS — Z79899 Other long term (current) drug therapy: Secondary | ICD-10-CM | POA: Diagnosis not present

## 2021-10-20 DIAGNOSIS — L400 Psoriasis vulgaris: Secondary | ICD-10-CM | POA: Diagnosis not present

## 2021-10-21 DIAGNOSIS — L400 Psoriasis vulgaris: Secondary | ICD-10-CM | POA: Diagnosis not present

## 2021-12-22 DIAGNOSIS — Z79899 Other long term (current) drug therapy: Secondary | ICD-10-CM | POA: Diagnosis not present

## 2021-12-22 DIAGNOSIS — L400 Psoriasis vulgaris: Secondary | ICD-10-CM | POA: Diagnosis not present

## 2021-12-23 DIAGNOSIS — L400 Psoriasis vulgaris: Secondary | ICD-10-CM | POA: Diagnosis not present

## 2022-02-06 DIAGNOSIS — H40033 Anatomical narrow angle, bilateral: Secondary | ICD-10-CM | POA: Diagnosis not present

## 2022-02-06 DIAGNOSIS — H2513 Age-related nuclear cataract, bilateral: Secondary | ICD-10-CM | POA: Diagnosis not present

## 2022-03-24 DIAGNOSIS — N390 Urinary tract infection, site not specified: Secondary | ICD-10-CM | POA: Diagnosis not present

## 2022-03-26 ENCOUNTER — Other Ambulatory Visit: Payer: Self-pay | Admitting: Internal Medicine

## 2022-03-26 DIAGNOSIS — N23 Unspecified renal colic: Secondary | ICD-10-CM

## 2022-03-26 DIAGNOSIS — N200 Calculus of kidney: Secondary | ICD-10-CM

## 2022-03-27 ENCOUNTER — Ambulatory Visit (HOSPITAL_COMMUNITY)
Admission: RE | Admit: 2022-03-27 | Discharge: 2022-03-27 | Disposition: A | Discharge status: Home / Self Care | Payer: Medicare Other | Source: Ambulatory Visit | Attending: Internal Medicine | Admitting: Internal Medicine

## 2022-03-27 DIAGNOSIS — N200 Calculus of kidney: Secondary | ICD-10-CM | POA: Diagnosis not present

## 2022-03-27 DIAGNOSIS — N23 Unspecified renal colic: Secondary | ICD-10-CM | POA: Insufficient documentation

## 2022-03-27 DIAGNOSIS — N281 Cyst of kidney, acquired: Secondary | ICD-10-CM | POA: Diagnosis not present

## 2022-03-27 DIAGNOSIS — K760 Fatty (change of) liver, not elsewhere classified: Secondary | ICD-10-CM | POA: Diagnosis not present

## 2022-03-27 DIAGNOSIS — N261 Atrophy of kidney (terminal): Secondary | ICD-10-CM | POA: Diagnosis not present

## 2022-04-14 DIAGNOSIS — L400 Psoriasis vulgaris: Secondary | ICD-10-CM | POA: Diagnosis not present

## 2022-05-18 DIAGNOSIS — L209 Atopic dermatitis, unspecified: Secondary | ICD-10-CM | POA: Diagnosis not present

## 2022-06-01 DIAGNOSIS — J4520 Mild intermittent asthma, uncomplicated: Secondary | ICD-10-CM | POA: Diagnosis not present

## 2022-06-01 DIAGNOSIS — R7301 Impaired fasting glucose: Secondary | ICD-10-CM | POA: Diagnosis not present

## 2022-06-01 DIAGNOSIS — L409 Psoriasis, unspecified: Secondary | ICD-10-CM | POA: Diagnosis not present

## 2022-06-05 DIAGNOSIS — L400 Psoriasis vulgaris: Secondary | ICD-10-CM | POA: Diagnosis not present

## 2022-06-05 DIAGNOSIS — Z0000 Encounter for general adult medical examination without abnormal findings: Secondary | ICD-10-CM | POA: Diagnosis not present

## 2022-06-05 DIAGNOSIS — Z23 Encounter for immunization: Secondary | ICD-10-CM | POA: Diagnosis not present

## 2022-06-05 DIAGNOSIS — R7309 Other abnormal glucose: Secondary | ICD-10-CM | POA: Diagnosis not present

## 2022-06-05 DIAGNOSIS — J4520 Mild intermittent asthma, uncomplicated: Secondary | ICD-10-CM | POA: Diagnosis not present

## 2022-08-18 DIAGNOSIS — L400 Psoriasis vulgaris: Secondary | ICD-10-CM | POA: Diagnosis not present

## 2022-10-08 ENCOUNTER — Encounter: Payer: Self-pay | Admitting: Radiology

## 2022-10-13 DIAGNOSIS — L400 Psoriasis vulgaris: Secondary | ICD-10-CM | POA: Diagnosis not present

## 2022-11-19 DIAGNOSIS — L290 Pruritus ani: Secondary | ICD-10-CM | POA: Diagnosis not present

## 2022-12-16 DIAGNOSIS — L290 Pruritus ani: Secondary | ICD-10-CM | POA: Diagnosis not present

## 2023-02-19 DIAGNOSIS — L290 Pruritus ani: Secondary | ICD-10-CM | POA: Diagnosis not present

## 2023-03-24 DIAGNOSIS — L400 Psoriasis vulgaris: Secondary | ICD-10-CM | POA: Diagnosis not present

## 2023-03-24 DIAGNOSIS — Z79899 Other long term (current) drug therapy: Secondary | ICD-10-CM | POA: Diagnosis not present

## 2023-04-27 DIAGNOSIS — L400 Psoriasis vulgaris: Secondary | ICD-10-CM | POA: Diagnosis not present

## 2023-04-27 DIAGNOSIS — Z79899 Other long term (current) drug therapy: Secondary | ICD-10-CM | POA: Diagnosis not present

## 2023-05-03 DIAGNOSIS — L400 Psoriasis vulgaris: Secondary | ICD-10-CM | POA: Diagnosis not present

## 2023-06-03 DIAGNOSIS — M1990 Unspecified osteoarthritis, unspecified site: Secondary | ICD-10-CM | POA: Diagnosis not present

## 2023-06-03 DIAGNOSIS — L400 Psoriasis vulgaris: Secondary | ICD-10-CM | POA: Diagnosis not present

## 2023-06-03 DIAGNOSIS — J45909 Unspecified asthma, uncomplicated: Secondary | ICD-10-CM | POA: Diagnosis not present

## 2023-06-03 DIAGNOSIS — Z79899 Other long term (current) drug therapy: Secondary | ICD-10-CM | POA: Diagnosis not present

## 2023-06-08 DIAGNOSIS — L400 Psoriasis vulgaris: Secondary | ICD-10-CM | POA: Diagnosis not present

## 2023-06-08 DIAGNOSIS — R7309 Other abnormal glucose: Secondary | ICD-10-CM | POA: Diagnosis not present

## 2023-06-08 DIAGNOSIS — Z0001 Encounter for general adult medical examination with abnormal findings: Secondary | ICD-10-CM | POA: Diagnosis not present

## 2023-06-08 DIAGNOSIS — S81002A Unspecified open wound, left knee, initial encounter: Secondary | ICD-10-CM | POA: Diagnosis not present

## 2023-06-08 DIAGNOSIS — Z23 Encounter for immunization: Secondary | ICD-10-CM | POA: Diagnosis not present

## 2023-06-08 DIAGNOSIS — Z0000 Encounter for general adult medical examination without abnormal findings: Secondary | ICD-10-CM | POA: Diagnosis not present

## 2023-07-28 ENCOUNTER — Encounter (INDEPENDENT_AMBULATORY_CARE_PROVIDER_SITE_OTHER): Payer: Self-pay | Admitting: *Deleted

## 2023-09-09 DIAGNOSIS — L400 Psoriasis vulgaris: Secondary | ICD-10-CM | POA: Diagnosis not present

## 2023-09-09 DIAGNOSIS — M79662 Pain in left lower leg: Secondary | ICD-10-CM | POA: Diagnosis not present

## 2023-09-09 DIAGNOSIS — Z79899 Other long term (current) drug therapy: Secondary | ICD-10-CM | POA: Diagnosis not present

## 2023-09-10 ENCOUNTER — Telehealth (INDEPENDENT_AMBULATORY_CARE_PROVIDER_SITE_OTHER): Payer: Self-pay | Admitting: Gastroenterology

## 2023-09-10 NOTE — Telephone Encounter (Signed)
Who is your primary care physician: Carylon Perches  Reasons for the colonoscopy:   Have you had a colonoscopy before?  Yes 2020  Do you have family history of colon cancer? Yes mother  Previous colonoscopy with polyps removed? Yes 09/08/2018  Do you have a history colorectal cancer?   no  Are you diabetic? If yes, Type 1 or Type 2?    no  Do you have a prosthetic or mechanical heart valve? no  Do you have a pacemaker/defibrillator?   no  Have you had endocarditis/atrial fibrillation? no  Have you had joint replacement within the last 12 months?  no  Do you tend to be constipated or have to use laxatives? no  Do you have any history of drugs or alchohol?  no  Do you use supplemental oxygen?  no  Have you had a stroke or heart attack within the last 6 months? no  Do you take weight loss medication?  no      Do you take any blood-thinning medications such as: (aspirin, warfarin, Plavix, Aggrenox)  no  If yes we need the name, milligram, dosage and who is prescribing doctor  Current Outpatient Medications on File Prior to Visit  Medication Sig Dispense Refill   albuterol (PROVENTIL HFA;VENTOLIN HFA) 108 (90 BASE) MCG/ACT inhaler Inhale 1-2 puffs into the lungs every 6 (six) hours as needed. For shortness of breath      Fluticasone-Salmeterol (ADVAIR) 250-50 MCG/DOSE AEPB Inhale 1 puff into the lungs every other day.     methotrexate 2.5 MG tablet Take 10 mg by mouth every Friday.  1   cetirizine (ZYRTEC) 10 MG tablet Take 10 mg by mouth daily as needed for allergies. (Patient not taking: Reported on 09/10/2023)     ibuprofen (ADVIL,MOTRIN) 200 MG tablet Take 400-800 mg by mouth daily as needed for headache or moderate pain.  (Patient not taking: Reported on 09/10/2023) 30 tablet 0   ondansetron (ZOFRAN ODT) 4 MG disintegrating tablet Take 1 tablet (4 mg total) by mouth every 8 (eight) hours as needed for nausea or vomiting. (Patient not taking: Reported on 09/10/2023) 20 tablet 0   No  current facility-administered medications on file prior to visit.    No Known Allergies   Pharmacy: [facility]  Primary Insurance Name: Medicare/[employer]  Best number where you can be reached: [phone]

## 2023-09-13 MED ORDER — SUTAB 1479-225-188 MG PO TABS: ORAL_TABLET | ORAL | 0 refills | Status: DC

## 2023-09-13 NOTE — Telephone Encounter (Signed)
Pt returned call. Pt scheduled. Prep sent to pharmacy-pt prefers tablets. Will mail instructions once pre op telephone call is scheduled.

## 2023-09-13 NOTE — Addendum Note (Signed)
Addended by: Marlowe Shores on: 09/13/2023 04:36 PM   Modules accepted: Orders

## 2023-09-13 NOTE — Telephone Encounter (Signed)
Room 1 Thanks

## 2023-09-13 NOTE — Telephone Encounter (Signed)
 Left message to return call

## 2023-09-14 ENCOUNTER — Telehealth (INDEPENDENT_AMBULATORY_CARE_PROVIDER_SITE_OTHER): Payer: Self-pay | Admitting: Gastroenterology

## 2023-09-14 NOTE — Telephone Encounter (Signed)
Pt left voicemail needing to reschedule TCS due to being out of town that week.  Pt originally scheduled for 10/19/23 Returned call to pt but had to leave message to return call

## 2023-09-14 NOTE — Telephone Encounter (Signed)
 Questionnaire from recall, no referral needed

## 2023-09-15 DIAGNOSIS — L400 Psoriasis vulgaris: Secondary | ICD-10-CM | POA: Diagnosis not present

## 2023-09-15 NOTE — Telephone Encounter (Signed)
Pt called in and was rescheduled to 11/02/23 at 8 am. Updated instructions will be mailed

## 2023-10-11 DIAGNOSIS — L400 Psoriasis vulgaris: Secondary | ICD-10-CM | POA: Diagnosis not present

## 2023-10-11 DIAGNOSIS — Z79899 Other long term (current) drug therapy: Secondary | ICD-10-CM | POA: Diagnosis not present

## 2023-10-12 DIAGNOSIS — L400 Psoriasis vulgaris: Secondary | ICD-10-CM | POA: Diagnosis not present

## 2023-10-15 ENCOUNTER — Other Ambulatory Visit (HOSPITAL_COMMUNITY): Payer: Medicare Other

## 2023-10-29 ENCOUNTER — Encounter (HOSPITAL_COMMUNITY)
Admission: RE | Admit: 2023-10-29 | Discharge: 2023-10-29 | Disposition: A | Discharge status: Home / Self Care | Payer: Medicare Other | Source: Ambulatory Visit | Attending: Gastroenterology | Admitting: Gastroenterology

## 2023-11-02 ENCOUNTER — Other Ambulatory Visit: Payer: Self-pay

## 2023-11-02 ENCOUNTER — Encounter (HOSPITAL_COMMUNITY): Payer: Self-pay | Admitting: Gastroenterology

## 2023-11-02 ENCOUNTER — Ambulatory Visit (HOSPITAL_COMMUNITY)
Admission: RE | Admit: 2023-11-02 | Discharge: 2023-11-02 | Disposition: A | Discharge status: Home / Self Care | Payer: Medicare Other | Attending: Gastroenterology | Admitting: Gastroenterology

## 2023-11-02 ENCOUNTER — Ambulatory Visit (HOSPITAL_COMMUNITY): Admitting: Anesthesiology

## 2023-11-02 ENCOUNTER — Encounter (HOSPITAL_COMMUNITY)
Admission: RE | Disposition: A | Discharge status: Home / Self Care | Payer: Self-pay | Source: Home / Self Care | Attending: Gastroenterology

## 2023-11-02 DIAGNOSIS — Z9889 Other specified postprocedural states: Secondary | ICD-10-CM

## 2023-11-02 DIAGNOSIS — D122 Benign neoplasm of ascending colon: Secondary | ICD-10-CM | POA: Insufficient documentation

## 2023-11-02 DIAGNOSIS — L409 Psoriasis, unspecified: Secondary | ICD-10-CM | POA: Insufficient documentation

## 2023-11-02 DIAGNOSIS — D120 Benign neoplasm of cecum: Secondary | ICD-10-CM

## 2023-11-02 DIAGNOSIS — M1990 Unspecified osteoarthritis, unspecified site: Secondary | ICD-10-CM | POA: Insufficient documentation

## 2023-11-02 DIAGNOSIS — I10 Essential (primary) hypertension: Secondary | ICD-10-CM | POA: Diagnosis not present

## 2023-11-02 DIAGNOSIS — Z800 Family history of malignant neoplasm of digestive organs: Secondary | ICD-10-CM | POA: Diagnosis not present

## 2023-11-02 DIAGNOSIS — J45909 Unspecified asthma, uncomplicated: Secondary | ICD-10-CM | POA: Diagnosis not present

## 2023-11-02 DIAGNOSIS — K635 Polyp of colon: Secondary | ICD-10-CM | POA: Diagnosis not present

## 2023-11-02 DIAGNOSIS — Z1211 Encounter for screening for malignant neoplasm of colon: Secondary | ICD-10-CM | POA: Insufficient documentation

## 2023-11-02 DIAGNOSIS — Z860101 Personal history of adenomatous and serrated colon polyps: Secondary | ICD-10-CM | POA: Diagnosis not present

## 2023-11-02 DIAGNOSIS — K5730 Diverticulosis of large intestine without perforation or abscess without bleeding: Secondary | ICD-10-CM

## 2023-11-02 HISTORY — PX: POLYPECTOMY: SHX5525

## 2023-11-02 HISTORY — PX: COLONOSCOPY WITH PROPOFOL: SHX5780

## 2023-11-02 LAB — HM COLONOSCOPY

## 2023-11-02 SURGERY — COLONOSCOPY WITH PROPOFOL
Anesthesia: General

## 2023-11-02 MED ORDER — PROPOFOL 500 MG/50ML IV EMUL
INTRAVENOUS | Status: DC | PRN
  Administered 2023-11-02: 150 ug/kg/min via INTRAVENOUS

## 2023-11-02 MED ORDER — LACTATED RINGERS IV SOLN: INTRAVENOUS | Status: DC

## 2023-11-02 NOTE — Discharge Instructions (Addendum)
 You are being discharged to home.  Resume your previous diet.  We are waiting for your pathology results.  Your physician has recommended a repeat colonoscopy in three years for surveillance.

## 2023-11-02 NOTE — Anesthesia Preprocedure Evaluation (Signed)
 Anesthesia Evaluation  Patient identified by MRN, date of birth, ID band Patient awake    Reviewed: Allergy & Precautions, H&P , NPO status , Patient's Chart, lab work & pertinent test results, reviewed documented beta blocker date and time   Airway Mallampati: II  TM Distance: >3 FB Neck ROM: full    Dental no notable dental hx.    Pulmonary neg pulmonary ROS, asthma , pneumonia   Pulmonary exam normal breath sounds clear to auscultation       Cardiovascular Exercise Tolerance: Good hypertension, negative cardio ROS  Rhythm:regular Rate:Normal     Neuro/Psych negative neurological ROS  negative psych ROS   GI/Hepatic negative GI ROS, Neg liver ROS,GERD  ,,  Endo/Other  negative endocrine ROS    Renal/GU negative Renal ROS  negative genitourinary   Musculoskeletal   Abdominal   Peds  Hematology negative hematology ROS (+)   Anesthesia Other Findings   Reproductive/Obstetrics negative OB ROS                             Anesthesia Physical Anesthesia Plan  ASA: 2  Anesthesia Plan: General   Post-op Pain Management:    Induction:   PONV Risk Score and Plan: Propofol infusion  Airway Management Planned:   Additional Equipment:   Intra-op Plan:   Post-operative Plan:   Informed Consent: I have reviewed the patients History and Physical, chart, labs and discussed the procedure including the risks, benefits and alternatives for the proposed anesthesia with the patient or authorized representative who has indicated his/her understanding and acceptance.     Dental Advisory Given  Plan Discussed with: CRNA  Anesthesia Plan Comments:        Anesthesia Quick Evaluation

## 2023-11-02 NOTE — H&P (Signed)
 Jeffrey Mcmahon is an 73 y.o. male.   Chief Complaint: history colon polyps and family history of colon cancer HPI: 73 y/o M with PMH GERD, constipation, asthma, arthritis, psoriasis, coming for  history colon polyps and family history of colon cancer. Last colonoscopy in 2020, had one TA and one SSL. The patient denies having any complaints such as melena, hematochezia, abdominal pain or distention, change in her bowel movement consistency or frequency, no changes in weight recently.  Mother had colon cancer at age 20   Past Medical History:  Diagnosis Date   Arthritis    Asthma    Colon polyps    Constipation    Diverticulitis    GERD (gastroesophageal reflux disease)    occasional   Pneumonia    December 2012   Pneumothorax    Psoriasis     Past Surgical History:  Procedure Laterality Date   COLONOSCOPY  07/29/2011   Procedure: COLONOSCOPY;  Surgeon: Malissa Hippo, MD;  [facility];  Service: Endoscopy;  Laterality: N/A;  7:30 am   COLONOSCOPY N/A 06/09/2017   Procedure: COLONOSCOPY;  Surgeon: Malissa Hippo, MD;  [facility];  Service: Endoscopy;  Laterality: N/A;  145   COLONOSCOPY N/A 09/08/2018   Procedure: COLONOSCOPY;  Surgeon: Malissa Hippo, MD;  [facility];  Service: Endoscopy;  Laterality: N/A;  1030   HERNIA REPAIR  1993   POLYPECTOMY  09/08/2018   Procedure: POLYPECTOMY;  Surgeon: Malissa Hippo, MD;  [facility];  Service: Endoscopy;;   Right hip surgery  1993   Secondary to a staph infection   TOTAL HIP ARTHROPLASTY Right 03/14/2013   Procedure: RIGHT TOTAL HIP ARTHROPLASTY ANTERIOR APPROACH;  Surgeon: Shelda Pal, MD;  Location: [facility];  Service: Orthopedics;  Laterality: Right;    Family History  Problem Relation Age of Onset   Diabetes Father    Heart attack Father    Colon cancer Mother    Social History:  reports that he has never smoked. He has never used smokeless tobacco. He reports that he does  not drink alcohol and does not use drugs.  Allergies: No Known Allergies  Medications Prior to Admission  Medication Sig Dispense Refill   albuterol (PROVENTIL HFA;VENTOLIN HFA) 108 (90 BASE) MCG/ACT inhaler Inhale 1-2 puffs into the lungs every 6 (six) hours as needed. For shortness of breath      cetirizine (ZYRTEC) 10 MG tablet Take 10 mg by mouth daily as needed for allergies.     Fluticasone-Salmeterol (ADVAIR) 250-50 MCG/DOSE AEPB Inhale 1 puff into the lungs every other day.     ibuprofen (ADVIL,MOTRIN) 200 MG tablet Take 400-800 mg by mouth daily as needed for headache or moderate pain (pain score 4-6). 30 tablet 0   methotrexate 2.5 MG tablet Take 10 mg by mouth every Friday.  1   ondansetron (ZOFRAN ODT) 4 MG disintegrating tablet Take 1 tablet (4 mg total) by mouth every 8 (eight) hours as needed for nausea or vomiting. (Patient not taking: Reported on 09/10/2023) 20 tablet 0   Sodium Sulfate-Mag Sulfate-KCl (SUTAB) [phone] MG TABS Take 12 tablet po 5 minutes apart at 4pm and again at 7pm before colonoscopy 24 tablet 0    No results found for this or any previous visit (from the past 48 hours). No results found.  Review of Systems  All other systems reviewed and are negative.   Blood pressure 125/83, pulse (!) 59, temperature  97.6 F (36.4 C), temperature source Oral, resp. rate 12, height 5\' 11"  (1.803 m), weight 83.5 kg, SpO2 97%. Physical Exam  GENERAL: The patient is AO x3, in no acute distress. HEENT: Head is normocephalic and atraumatic. EOMI are intact. Mouth is well hydrated and without lesions. NECK: Supple. No masses LUNGS: Clear to auscultation. No presence of rhonchi/wheezing/rales. Adequate chest expansion HEART: RRR, normal s1 and s2. ABDOMEN: Soft, nontender, no guarding, no peritoneal signs, and nondistended. BS +. No masses. EXTREMITIES: Without any cyanosis, clubbing, rash, lesions or edema. NEUROLOGIC: AOx3, no focal motor deficit. SKIN: no jaundice,  no rashes  Assessment/Plan 73 y/o M with PMH GERD, constipation, asthma, arthritis, psoriasis, coming for  history colon polyps and family history of colon cancer. Will proceed with colonoscopy.  Dolores Frame, MD 11/02/2023, 7:49 AM

## 2023-11-02 NOTE — Op Note (Addendum)
 [facility] Patient Name: Jeffrey Mcmahon Procedure Date: 11/02/2023 8:04 AM MRN: [id number] Date of Birth: [birth year] Attending MD: Katrinka Blazing , , [id number] CSN: [id number] Age: 73 Admit Type: Outpatient Procedure:                Colonoscopy Indications:              Screening patient at increased risk: Family history                            of 1st-degree relative with colorectal cancer at                            age 50 years (or older), Surveillance: Personal                            history of adenomatous polyps on last colonoscopy 5                            years ago, High risk colon cancer surveillance:                            Personal history of sessile serrated colon polyp                            (less than 10 mm in size) with no dysplasia Providers:                Katrinka Blazing, Buel Ream. Museum/gallery exhibitions officer, Charity fundraiser,                            Judeth Cornfield. Jessee Avers, Technician Referring MD:              Medicines:                Monitored Anesthesia Care Complications:            No immediate complications. Estimated Blood Loss:     Estimated blood loss: none. Procedure:                Pre-Anesthesia Assessment:                           - Prior to the procedure, a History and Physical                            was performed, and patient medications, allergies                            and sensitivities were reviewed. The patient's                            tolerance of previous anesthesia was reviewed.                           - The risks and benefits of the procedure and the  sedation options and risks were discussed with the                            patient. All questions were answered and informed                            consent was obtained.                           - ASA Grade Assessment: III - A patient with severe                            systemic disease.                           After obtaining informed consent,  the colonoscope                            was passed under direct vision. Throughout the                            procedure, the patient's blood pressure, pulse, and                            oxygen saturations were monitored continuously. The                            PCF-HQ190L ([id number]) scope was introduced through                            the anus and advanced to the the cecum, identified                            by appendiceal orifice and ileocecal valve. The                            colonoscopy was performed without difficulty. The                            patient tolerated the procedure well. The quality                            of the bowel preparation was adequate to identify                            polyps greater than 5 mm in size. Scope In: 8:23:52 AM Scope Out: 8:43:11 AM Scope Withdrawal Time: 0 hours 15 minutes 22 seconds  Total Procedure Duration: 0 hours 19 minutes 19 seconds  Findings:      The perianal and digital rectal examinations were normal.      Three sessile polyps were found in the ascending colon and cecum. The       polyps were 3 to 5 mm in size. These polyps were removed with a cold       snare. Resection and retrieval  were complete.      Multiple medium-mouthed and small-mouthed diverticula were found in the       entire colon.      A tattoo was seen in the recto-sigmoid colon. The tattoo site appeared       normal.      The retroflexed view of the distal rectum and anal verge was normal and       showed no anal or rectal abnormalities. Impression:               - Three 3 to 5 mm polyps in the ascending colon and                            in the cecum, removed with a cold snare. Resected                            and retrieved.                           - Diverticulosis in the entire examined colon.                           - A tattoo was seen in the recto-sigmoid colon. The                            tattoo site appeared normal.                            - The distal rectum and anal verge are normal on                            retroflexion view. Moderate Sedation:      Per Anesthesia Care Recommendation:           - Discharge patient to home (ambulatory).                           - Resume previous diet.                           - Await pathology results.                           - Repeat colonoscopy in 3 years for surveillance. Procedure Code(s):        --- Professional ---                           [phone], Colonoscopy, flexible; with removal of                            tumor(s), polyp(s), or other lesion(s) by snare                            technique Diagnosis Code(s):        --- Professional ---  Z80.0, Family history of malignant neoplasm of                            digestive organs                           Z86.010, Personal history of colonic polyps                           D12.2, Benign neoplasm of ascending colon                           D12.0, Benign neoplasm of cecum                           K57.30, Diverticulosis of large intestine without                            perforation or abscess without bleeding CPT copyright 2022 American Medical Association. All rights reserved. The codes documented in this report are preliminary and upon coder review may  be revised to meet current compliance requirements. Katrinka Blazing, MD Katrinka Blazing,  11/02/2023 8:53:18 AM This report has been signed electronically. Number of Addenda: 0

## 2023-11-02 NOTE — Anesthesia Postprocedure Evaluation (Signed)
 Anesthesia Post Note  Patient: Jeffrey Mcmahon  Procedure(s) Performed: COLONOSCOPY WITH PROPOFOL POLYPECTOMY  Patient location during evaluation: Short Stay Anesthesia Type: General Level of consciousness: awake Pain management: pain level controlled Vital Signs Assessment: post-procedure vital signs reviewed and stable Cardiovascular status: blood pressure returned to baseline and stable Postop Assessment: no apparent nausea or vomiting Anesthetic complications: no   No notable events documented.   Last Vitals:  Vitals:   11/02/23 0700 11/02/23 0847  BP: 125/83 (!) 93/57  Pulse: (!) 59 97  Resp: 12 14  Temp:  (!) 35.9 C  SpO2: 97% 97%    Last Pain:  Vitals:   11/02/23 0847  TempSrc: Axillary  PainSc: 0-No pain                 Theon Sobotka

## 2023-11-02 NOTE — Transfer of Care (Signed)
 Immediate Anesthesia Transfer of Care Note  Patient: Jeffrey Mcmahon  Procedure(s) Performed: COLONOSCOPY WITH PROPOFOL POLYPECTOMY  Patient Location: Short Stay  Anesthesia Type:General  Level of Consciousness: awake  Airway & Oxygen Therapy: Patient Spontanous Breathing  Post-op Assessment: Report given to RN  Post vital signs: Reviewed and stable  Last Vitals:  Vitals Value Taken Time  BP 93/57 11/02/23 0847  Temp 35.9 C 11/02/23 0847  Pulse 97 11/02/23 0847  Resp 14 11/02/23 0847  SpO2 97 % 11/02/23 0847    Last Pain:  Vitals:   11/02/23 0847  TempSrc: Axillary  PainSc: 0-No pain         Complications: No notable events documented.

## 2023-11-03 ENCOUNTER — Encounter (INDEPENDENT_AMBULATORY_CARE_PROVIDER_SITE_OTHER): Payer: Self-pay | Admitting: *Deleted

## 2023-11-03 ENCOUNTER — Encounter (HOSPITAL_COMMUNITY): Payer: Self-pay | Admitting: Gastroenterology

## 2023-11-04 ENCOUNTER — Encounter (INDEPENDENT_AMBULATORY_CARE_PROVIDER_SITE_OTHER): Payer: Self-pay | Admitting: *Deleted

## 2023-11-04 LAB — SURGICAL PATHOLOGY

## 2023-11-08 DIAGNOSIS — Z79899 Other long term (current) drug therapy: Secondary | ICD-10-CM | POA: Diagnosis not present

## 2023-11-08 DIAGNOSIS — L400 Psoriasis vulgaris: Secondary | ICD-10-CM | POA: Diagnosis not present

## 2023-11-09 DIAGNOSIS — L400 Psoriasis vulgaris: Secondary | ICD-10-CM | POA: Diagnosis not present

## 2023-11-30 ENCOUNTER — Ambulatory Visit: Payer: Medicare Other | Admitting: Dermatology

## 2023-11-30 DIAGNOSIS — L409 Psoriasis, unspecified: Secondary | ICD-10-CM | POA: Diagnosis not present

## 2023-11-30 DIAGNOSIS — Z79899 Other long term (current) drug therapy: Secondary | ICD-10-CM

## 2023-11-30 DIAGNOSIS — Z7189 Other specified counseling: Secondary | ICD-10-CM

## 2023-11-30 NOTE — Patient Instructions (Signed)

## 2023-11-30 NOTE — Progress Notes (Signed)
 New Patient Visit   Subjective  Jeffrey Mcmahon is a 73 y.o. male who presents for the following: Patient reports history of psoriasis for over 10 years. Reports flares at knees, legs, and elbows. Currently taking methotrexate and using clobetasol as needed for flares. Reports has tried and failed Otezla.  Would like to discuss treatment and has been approved by insurance for XTRAC treatment.    The patient has spots, moles and lesions to be evaluated, some may be new or changing and the patient may have concern these could be cancer.   The following portions of the chart were reviewed this encounter and updated as appropriate: medications, allergies, medical history  Review of Systems:  No other skin or systemic complaints except as noted in HPI or Assessment and Plan.  Objective  Well appearing patient in no apparent distress; mood and affect are within normal limits.   A focused examination was performed of the following areas: Legs, ankles, knees, elbows, abdomen, chest, back, arms, face, scalp, hands   Relevant exam findings are noted in the Assessment and Plan.    Assessment & Plan   PSORIASIS Exam: large pink scaly plaques at Right lower leg, calves, knees, lft ankle , elbows b/l , right abdomen , left abdomen, left upper hip, forehead and nail dystrophy in b/l thumb nails  10% BSA.  Chronic and persistent condition with duration or expected duration over one year. Condition is bothersome/symptomatic for patient. Currently flared.  Patient reports he has tried and failed Otezla, currently taking Methotrexate without improvement patient denies joint pain   Psoriasis is a chronic non-curable, but treatable genetic/hereditary disease that may have other systemic features affecting other organ systems such as joints (Psoriatic Arthritis). It is associated with an increased risk of inflammatory bowel disease, heart disease, non-alcoholic fatty liver disease, and depression.   Treatments include light and laser treatments; topical medications; and systemic medications including oral and injectables. Patient reports has tried and failed   Treatment Plan: Discussed biologic treatment  Discussed can stop methotrexate if start biologic treatment  Continue clobetasol cream every day to aas, avoid f/g/a, pt has Pending labs Q TB Gold Plus, Hep B surface antibody qualitative, Hep B surface antigen, Hep C antibody  Pt will bring in copy of most recent labs from 3 weeks ago Pending labs will send rx for Tremfya  to [facility]  Discussed xtrac treatment could take multiple treatments may take 3 to 6 month to improve   Reviewed risks of biologics including immunosuppression, infections, injection site reaction, and failure to improve condition. Goal is control of skin condition, not cure.  Some older biologics such as Humira and Enbrel may slightly increase risk of malignancy and may worsen congestive heart failure.  Taltz and Cosentyx may cause inflammatory bowel disease to flare. The use of biologics requires long term medication management, including periodic office visits and monitoring of blood work.  Long term medication management.  Patient is using long term (months to years) prescription medication  to control their dermatologic condition.  These medications require periodic monitoring to evaluate for efficacy and side effects and may require periodic laboratory monitoring.    PSORIASIS   Related Procedures QuantiFERON-TB Gold Plus Hepatitis B surface antibody,qualitative Hepatitis B surface antigen Hepatitis C antibody  Return for 1 month psoriasis follow up.  I, Randee Busing, CMA, am acting as scribe for [name], MD.   Documentation: I have reviewed the above documentation for accuracy and completeness, and I agree with  the above.  [name], MD

## 2023-12-03 LAB — QUANTIFERON-TB GOLD PLUS
QuantiFERON Mitogen Value: >10 [IU]/mL
QuantiFERON Nil Value: 0.1 [IU]/mL
QuantiFERON TB1 Ag Value: 0.09 [IU]/mL
QuantiFERON TB2 Ag Value: 0.09 [IU]/mL
QuantiFERON-TB Gold Plus: NEGATIVE

## 2023-12-03 LAB — HEPATITIS B SURFACE ANTIBODY,QUALITATIVE: Hep B Surface Ab, Qual: NONREACTIVE

## 2023-12-03 LAB — HEPATITIS C ANTIBODY: Hep C Virus Ab: NONREACTIVE

## 2023-12-03 LAB — HEPATITIS B SURFACE ANTIGEN: Hepatitis B Surface Ag: NEGATIVE

## 2023-12-06 ENCOUNTER — Telehealth: Payer: Self-pay

## 2023-12-06 MED ORDER — TREMFYA 100 MG/ML ~~LOC~~ SOSY: 100.0000 mg | PREFILLED_SYRINGE | SUBCUTANEOUS | 0 refills | Status: DC

## 2023-12-06 MED ORDER — TREMFYA 100 MG/ML ~~LOC~~ SOSY: 100.0000 mg | PREFILLED_SYRINGE | SUBCUTANEOUS | 1 refills | Status: DC

## 2023-12-06 NOTE — Telephone Encounter (Signed)
 Patient advised of labs and Tremfya syringes sent into [place]. aw

## 2023-12-06 NOTE — Addendum Note (Signed)
 Addended by: Lisbeth Rides R on: 12/06/2023 02:40 PM   Modules accepted: Orders

## 2023-12-06 NOTE — Telephone Encounter (Signed)
 Left pt msg to call for lab results.  Need to send in Tremfya for patient but need to know if he would like the auto injector or syringe./sh

## 2023-12-06 NOTE — Telephone Encounter (Signed)
-----   Message from [name] sent at 12/06/2023 12:57 PM EDT ----- Hep panel negative, TB negative, send in Rx for Tremfya - please call patient

## 2023-12-10 DIAGNOSIS — Z79899 Other long term (current) drug therapy: Secondary | ICD-10-CM | POA: Diagnosis not present

## 2023-12-10 DIAGNOSIS — L400 Psoriasis vulgaris: Secondary | ICD-10-CM | POA: Diagnosis not present

## 2023-12-13 DIAGNOSIS — L400 Psoriasis vulgaris: Secondary | ICD-10-CM | POA: Diagnosis not present

## 2023-12-15 ENCOUNTER — Telehealth: Payer: Self-pay

## 2023-12-15 NOTE — Telephone Encounter (Signed)
 Patient's insurance is denying Tremfya  due to patient not trying and failing two of the following drugs: Skyrizi, Sotyktu, or Stelara  Please advise

## 2023-12-20 MED ORDER — SKYRIZI PEN 150 MG/ML ~~LOC~~ SOAJ: 150.0000 mg | SUBCUTANEOUS | 1 refills | Status: AC

## 2024-01-11 ENCOUNTER — Ambulatory Visit: Admitting: Dermatology

## 2024-01-11 DIAGNOSIS — Z79899 Other long term (current) drug therapy: Secondary | ICD-10-CM

## 2024-01-11 DIAGNOSIS — L409 Psoriasis, unspecified: Secondary | ICD-10-CM | POA: Diagnosis not present

## 2024-01-11 DIAGNOSIS — Z7189 Other specified counseling: Secondary | ICD-10-CM

## 2024-01-11 NOTE — Progress Notes (Signed)
   Follow-Up Visit   Subjective  Jeffrey Mcmahon is a 73 y.o. male who presents for the following: Psoriasis of the arms, legs, trunk, and face. He has been approved for Skyrizi  injections, but needs to contact insurance provider for payment plan. He is using clobetasol cream as needed for flares, and is still currently taking methotrexate 4 a week. No joint pain.  He has tried and failed Otezla in the past. Tremfya  was denied due to not trying other biologics first.   The following portions of the chart were reviewed this encounter and updated as appropriate: medications, allergies, medical history.  Review of Systems:  No other skin or systemic complaints except as noted in HPI or Assessment and Plan.  Objective  Well appearing patient in no apparent distress; mood and affect are within normal limits.  Areas Examined: Face, trunk, extremities  Relevant exam findings are noted in the Assessment and Plan.    Assessment & Plan     PSORIASIS Pink scaly plaques on the arms, elbows, legs, forehead, temples, ears, abdomen, 10% BSA.  Chronic and persistent condition with duration or expected duration over one year. Condition is bothersome/symptomatic for patient. Currently flared.  Patient denies joint pain  Patient reports he has tried and failed Otezla, currently taking Methotrexate without improvement patient denies joint pain   Treatment Plan: Labs 11/30/2023 Continue clobetasol cream every day to aas, Avoid applying to face, groin, and axilla. Use as directed. Long-term use can cause thinning of the skin.  Patient has been approved for Skyrizi  injections, but needs to contact insurance company for Prescription Payment Plan. He will have Rx shipped to his house. Once received, he will schedule nurse visit for injection.    Discussed can stop methotrexate after loading dose of biologic treatment.   Counseling on psoriasis and coordination of care  psoriasis is a chronic  non-curable, but treatable genetic/hereditary disease that may have other systemic features affecting other organ systems such as joints (Psoriatic Arthritis). It is associated with an increased risk of inflammatory bowel disease, heart disease, non-alcoholic fatty liver disease, and depression.  Treatments include light and laser treatments; topical medications; and systemic medications including oral and injectables.   Return in about 2 months (around 03/12/2024) for Psoriasis w/Dr Annette Barters. Pt will call for nurse visit for Skyrizi  injection once received. Jeffrey Mcmahon, CMA, am acting as scribe for Jeffrey Larry, MD .   Documentation: I have reviewed the above documentation for accuracy and completeness, and I agree with the above.  Jeffrey Larry, MD

## 2024-01-11 NOTE — Patient Instructions (Addendum)
 [facility] Your prescription has been sent to [facility].  [facility] will call you to obtain additional  information needed to fill your prescription such as:  Additional insurance information Payment for any out-of-pocket expense (may require credit card) Address for drug delivery shipment  A timely response to the pharmacy may help you obtain your medication more quickly. Please call [facility] at [phone].   Due to recent changes in healthcare laws, you may see results of your pathology and/or laboratory studies on Jeffrey Mcmahon before the doctors have had a chance to review them. We understand that in some cases there may be results that are confusing or concerning to you. Please understand that not all results are received at the same time and often the doctors may need to interpret multiple results in order to provide you with the best plan of care or course of treatment. Therefore, we ask that you please give us  2 business days to thoroughly review all your results before contacting the office for clarification. Should we see a critical lab result, you will be contacted sooner.   If You Need Anything After Your Visit  If you have any questions or concerns for your doctor, please call our main line at [phone] and press option 4 to reach your doctor's medical assistant. If no one answers, please leave a voicemail as directed and we will return your call as soon as possible. Messages left after 4 pm will be answered the following business day.   You may also send us  a message via [facility]. We typically respond to Jeffrey Mcmahon messages within 1-2 business days.  For prescription refills, please ask your pharmacy to contact our office. Our fax number is [phone].  If you have an urgent issue when the clinic is closed that cannot wait until the next business day, you can page your doctor at the number below.    Please note that while we do our  best to be available for urgent issues outside of office hours, we are not available 24/7.   If you have an urgent issue and are unable to reach us , you may choose to seek medical care at your doctor's office, retail clinic, urgent care center, or emergency room.  If you have a medical emergency, please immediately call 911 or go to the emergency department.  Pager Numbers  - Dr. Bary Likes: [phone]  - Dr. Annette Barters: [phone]  - Dr. Felipe Horton: [phone]   In the event of inclement weather, please call our main line at [phone] for an update on the status of any delays or closures.  Dermatology Medication Tips: Please keep the boxes that topical medications come in in order to help keep track of the instructions about where and how to use these. Pharmacies typically print the medication instructions only on the boxes and not directly on the medication tubes.   If your medication is too expensive, please contact our office at [phone] option 4 or send us  a message through Jeffrey Mcmahon.   We are unable to tell what your co-pay for medications will be in advance as this is different depending on your insurance coverage. However, we may be able to find a substitute medication at lower cost or fill out paperwork to get insurance to cover a needed medication.   If a prior authorization is required to get your medication covered by your insurance company, please allow us  1-2 business days to complete this process.  Drug prices often vary depending  on where the prescription is filled and some pharmacies may offer cheaper prices.  The website [email or web address] contains coupons for medications through different pharmacies. The prices here do not account for what the cost may be with help from insurance (it may be cheaper with your insurance), but the website can give you the price if you did not use any insurance.  - You can print the associated coupon and take it with your prescription to the  pharmacy.  - You may also stop by our office during regular business hours and pick up a Jeffrey Mcmahon coupon card.  - If you need your prescription sent electronically to a different pharmacy, notify our office through [facility] or by phone at [phone] option 4.     Si Usted Necesita Algo Despus de Su Visita  Tambin puede enviarnos un mensaje a travs de Clinical cytogeneticist. Por lo general respondemos a los mensajes de Jeffrey Mcmahon en el transcurso de 1 a 2 das hbiles.  Para renovar recetas, por favor pida a su farmacia que se ponga en contacto con nuestra oficina. Jeffrey Mcmahon de fax es [place] [phone].  Si tiene un asunto urgente cuando la clnica est cerrada y que no puede esperar hasta el siguiente da hbil, puede llamar/localizar a su doctor(a) al nmero que aparece a continuacin.   Por favor, tenga en cuenta que aunque hacemos todo lo posible para estar disponibles para asuntos urgentes fuera del horario de [place], no estamos disponibles las 24 horas del da, los 7 [address]  [address] de la [place].   Si tiene un problema urgente y no puede comunicarse con nosotros, puede optar por buscar atencin mdica  en el consultorio de su doctor(a), en una clnica privada, en un centro de atencin urgente o en una sala de emergencias.  Si tiene Engineer, drilling, por favor llame inmediatamente al 911 o vaya a la sala de emergencias.  Nmeros de bper  - Dr. Bary Likes: [phone]  - Dra. Annette Barters: [phone]  - Dr. Felipe Horton: [phone]   En caso de inclemencias del tiempo, por favor llame a Jeffrey Mcmahon principal al [phone] para una actualizacin sobre el [place] de cualquier retraso o cierre.  Consejos para la medicacin en dermatologa: Por favor, guarde las cajas en las que vienen los medicamentos de uso tpico para ayudarle a seguir las instrucciones sobre dnde y cmo usarlos. Las farmacias generalmente imprimen las instrucciones del medicamento slo en las cajas y no directamente en los  tubos del [place].   Si su medicamento es muy caro, por favor, pngase en contacto con Jeffrey Mcmahon [place] llamando al [phone] y presione la opcin 4 o envenos un mensaje a travs de Clinical cytogeneticist.   No podemos decirle cul ser su copago por los medicamentos por adelantado ya que esto es diferente dependiendo de la cobertura de su seguro. Sin embargo, es posible que podamos encontrar un medicamento sustituto a Audiological scientist un formulario para que el seguro cubra el medicamento que se considera necesario.   Si se requiere una autorizacin previa para que su compaa de seguros [place] su medicamento, por favor permtanos de 1 a 2 das hbiles para completar este proceso.  Los precios de los medicamentos varan con frecuencia dependiendo del Environmental consultant de dnde se surte la receta y alguna farmacias pueden ofrecer precios ms baratos.  El sitio web [email or web address] tiene cupones para medicamentos de Health and safety inspector. Los precios aqu no tienen en cuenta lo que podra costar con la ayuda del seguro (puede ser ms barato con su seguro),  pero el sitio web puede darle el precio si no Visual merchandiser.  - Puede imprimir el cupn correspondiente y llevarlo con su receta a la farmacia.  - Tambin puede pasar por nuestra oficina durante el horario de atencin regular y Education officer, museum una tarjeta de cupones de Jeffrey Mcmahon.  - Si necesita que su receta se enve electrnicamente a una farmacia diferente, informe a nuestra oficina a travs de Jeffrey Mcmahon de [place] o por telfono llamando al [phone] y presione la opcin 4.

## 2024-01-13 ENCOUNTER — Ambulatory Visit

## 2024-01-13 ENCOUNTER — Telehealth: Payer: Self-pay

## 2024-01-13 NOTE — Telephone Encounter (Signed)
 Patient's spouse Wallene Gum called and left voicemail canceling today's nurse visit appointment due to Skyrizi  injections not delivered to their home yet. Patient will call back once injections deliver and will call to schedule nurse appointment then.

## 2024-01-17 ENCOUNTER — Ambulatory Visit (INDEPENDENT_AMBULATORY_CARE_PROVIDER_SITE_OTHER)

## 2024-01-17 DIAGNOSIS — L409 Psoriasis, unspecified: Secondary | ICD-10-CM

## 2024-01-17 MED ORDER — RISANKIZUMAB-RZAA 150 MG/ML ~~LOC~~ SOAJ
150.0000 mg | Freq: Once | SUBCUTANEOUS | Status: AC
Start: 2024-01-17 — End: 2024-01-17
  Administered 2024-01-17: 150 mg via SUBCUTANEOUS

## 2024-01-17 NOTE — Progress Notes (Signed)
 Patient here today for start of Skyrizi  injection for psoriasis.   Skyrizi  150mg  Pen injected into L upper arm. Patient tolerated injection well.  Lot: [id number] EXP: 02/07/2025  Spouse advised how to do injections at home.   Lisbeth Rides, RMA

## 2024-02-14 ENCOUNTER — Ambulatory Visit

## 2024-02-14 DIAGNOSIS — L409 Psoriasis, unspecified: Secondary | ICD-10-CM

## 2024-02-14 MED ORDER — RISANKIZUMAB-RZAA 150 MG/ML ~~LOC~~ SOAJ
150.0000 mg | Freq: Once | SUBCUTANEOUS | Status: AC
  Administered 2024-02-14: 150 mg via SUBCUTANEOUS

## 2024-02-14 NOTE — Progress Notes (Signed)
 Patient here today for Skyrizi  injection for psoriasis.    Skyrizi  150mg  Pen injected into R upper arm. Patient tolerated injection well.  Lot: [id number] EXP: 03/10/2025    [name], RMA

## 2024-02-15 ENCOUNTER — Ambulatory Visit: Admitting: Dermatology

## 2024-02-15 DIAGNOSIS — D492 Neoplasm of unspecified behavior of bone, soft tissue, and skin: Secondary | ICD-10-CM | POA: Diagnosis not present

## 2024-02-15 DIAGNOSIS — L578 Other skin changes due to chronic exposure to nonionizing radiation: Secondary | ICD-10-CM | POA: Diagnosis not present

## 2024-02-15 DIAGNOSIS — C4491 Basal cell carcinoma of skin, unspecified: Secondary | ICD-10-CM

## 2024-02-15 DIAGNOSIS — L853 Xerosis cutis: Secondary | ICD-10-CM

## 2024-02-15 DIAGNOSIS — L299 Pruritus, unspecified: Secondary | ICD-10-CM | POA: Diagnosis not present

## 2024-02-15 DIAGNOSIS — C4401 Basal cell carcinoma of skin of lip: Secondary | ICD-10-CM

## 2024-02-15 DIAGNOSIS — W908XXA Exposure to other nonionizing radiation, initial encounter: Secondary | ICD-10-CM

## 2024-02-15 DIAGNOSIS — C44310 Basal cell carcinoma of skin of unspecified parts of face: Secondary | ICD-10-CM

## 2024-02-15 HISTORY — DX: Basal cell carcinoma of skin, unspecified: C44.91

## 2024-02-15 NOTE — Progress Notes (Unsigned)
 Follow-Up Visit   Subjective  Jeffrey Mcmahon is a 73 y.o. male who presents for the following: place above lip appeared x 4-5 months ago, not growing, not painful, does bleed when shaving. Pt has psoriasis and just got second dose of Skyrizi , doing well, but not here for psoriasis today.  Did have recent bout of itching all over that lasted about a week.  The patient has spots, moles and lesions to be evaluated, some may be new or changing and the patient may have concern these could be cancer.   The following portions of the chart were reviewed this encounter and updated as appropriate: medications, allergies, medical history  Review of Systems:  No other skin or systemic complaints except as noted in HPI or Assessment and Plan.  Objective  Well appearing patient in no apparent distress; mood and affect are within normal limits.  A focused examination was performed of the following areas: Face  Relevant exam findings are noted in the Assessment and Plan.  Left Upper Cutaneous Lip/ Inferior L nostril 4 mm pink ulcerated papule   Assessment & Plan   ACTINIC DAMAGE - chronic, secondary to cumulative UV radiation exposure/sun exposure over time - diffuse scaly erythematous macules with underlying dyspigmentation - Recommend daily broad spectrum sunscreen SPF 30+ to sun-exposed areas, reapply every 2 hours as needed.  - Recommend staying in the shade or wearing long sleeves, sun glasses (UVA+UVB protection) and wide brim hats (4-inch brim around the entire circumference of the hat). - Call for new or changing lesions.  Xerosis with Pruritus  - diffuse xerotic patches - recommend gentle, hydrating skin care - gentle skin care handout given - Recommend using gentle soap like Dove soap (stop Amish soap), may use Cerave Anti-Itch lotion for itching. Samples given to patient.   NEOPLASM OF SKIN Left Upper Cutaneous Lip/ Inferior L nostril Epidermal / dermal shaving  Lesion  diameter (cm):  0.4 Informed consent: discussed and consent obtained   Patient was prepped and draped in usual sterile fashion: area prepped with alcohol. Anesthesia: the lesion was anesthetized in a standard fashion   Anesthetic:  1% lidocaine w/ epinephrine 1-100,000 buffered w/ 8.4% NaHCO3 Instrument used: flexible razor blade   Hemostasis achieved with: pressure, aluminum chloride and electrodesiccation   Outcome: patient tolerated procedure well    Destruction of lesion  Destruction method: electrodesiccation and curettage   Informed consent: discussed and consent obtained   Curettage performed in three different directions: Yes   Electrodesiccation performed over the curetted area: Yes   Final wound size (cm):  0.6 Hemostasis achieved with:  pressure, aluminum chloride and electrodesiccation Outcome: patient tolerated procedure well with no complications   Post-procedure details: wound care instructions given   Additional details:  Mupirocin ointment and Bandaid applied   Specimen 1 - Surgical pathology Differential Diagnosis: R/o BCC  Check Margins: No 4 mm pink ulcerated papule EDC today Discussed treatment options including EDC but would leave a depressed round white scar or surgical excision would leave a line scar, but would be less noticeable but would refer for [facility] pending biopsy results.   Patient prefers to have EDC done today at time of biopsy.  ACTINIC SKIN DAMAGE   XEROSIS CUTIS   PRURITUS    Return for w/ Dr. Jackquline, As scheduled. For psoriasis  I, Jacquelynn V. Wilfred, CMA, am acting as scribe for Rexene Jackquline, MD .   Documentation: I have reviewed the above documentation for accuracy and completeness, and  I agree with the above.  Rexene Rattler, MD

## 2024-02-15 NOTE — Patient Instructions (Addendum)

## 2024-02-16 ENCOUNTER — Ambulatory Visit: Payer: Self-pay | Admitting: Dermatology

## 2024-02-16 LAB — SURGICAL PATHOLOGY

## 2024-02-17 ENCOUNTER — Encounter: Payer: Self-pay | Admitting: Dermatology

## 2024-02-17 NOTE — Telephone Encounter (Signed)
 Left pt msg to call for bx result/sh

## 2024-02-17 NOTE — Telephone Encounter (Signed)
-----   Message from Rexene Rattler sent at 02/16/2024  8:15 PM EDT ----- 1. Skin, left upper cutaneous lip/ inferior L nostril :       BASAL CELL CARCINOMA, NODULAR PATTERN   BCC skin cancer- already treated with EDC at time of biopsy  - please call patient ----- Message ----- From: Interface, Lab In Three Zero One Sent: 02/16/2024   6:40 PM EDT To: Rexene Rattler, MD

## 2024-02-17 NOTE — Telephone Encounter (Signed)
 Patient has been advised of BX results. aw

## 2024-03-20 ENCOUNTER — Ambulatory Visit: Admitting: Dermatology

## 2024-04-12 ENCOUNTER — Ambulatory Visit (INDEPENDENT_AMBULATORY_CARE_PROVIDER_SITE_OTHER): Admitting: Dermatology

## 2024-04-12 DIAGNOSIS — W908XXA Exposure to other nonionizing radiation, initial encounter: Secondary | ICD-10-CM

## 2024-04-12 DIAGNOSIS — L578 Other skin changes due to chronic exposure to nonionizing radiation: Secondary | ICD-10-CM | POA: Diagnosis not present

## 2024-04-12 DIAGNOSIS — L409 Psoriasis, unspecified: Secondary | ICD-10-CM

## 2024-04-12 DIAGNOSIS — Z7189 Other specified counseling: Secondary | ICD-10-CM | POA: Diagnosis not present

## 2024-04-12 DIAGNOSIS — Z79899 Other long term (current) drug therapy: Secondary | ICD-10-CM

## 2024-04-12 DIAGNOSIS — Z85828 Personal history of other malignant neoplasm of skin: Secondary | ICD-10-CM

## 2024-04-12 NOTE — Patient Instructions (Addendum)
 Reviewed risks of biologics including immunosuppression, infections (i.e. TB reactivation), injection site reaction, and failure to improve condition. Goal is control of skin condition, not cure.  Some older biologics such as Humira and Enbrel may slightly increase risk of malignancy and may worsen congestive heart failure.  Taltz, Cosentyx, and Bimzelx may cause inflammatory bowel disease to flare or may increase incidence of yeast infections. Skyrizi , Tremfya , and Stelara may also slightly increase risk of infection. The use of biologics requires long term medication management, including periodic office visits, annual TB screening test and monitoring of blood work.    Due to recent changes in healthcare laws, you may see results of your pathology and/or laboratory studies on MyChart before the doctors have had a chance to review them. We understand that in some cases there may be results that are confusing or concerning to you. Please understand that not all results are received at the same time and often the doctors may need to interpret multiple results in order to provide you with the best plan of care or course of treatment. Therefore, we ask that you please give us  2 business days to thoroughly review all your results before contacting the office for clarification. Should we see a critical lab result, you will be contacted sooner.   If You Need Anything After Your Visit  If you have any questions or concerns for your doctor, please call our main line at [phone] and press option 4 to reach your doctor's medical assistant. If no one answers, please leave a voicemail as directed and we will return your call as soon as possible. Messages left after 4 pm will be answered the following business day.   You may also send us  a message via MyChart. We typically respond to MyChart messages within 1-2 business days.  For prescription refills, please ask your pharmacy to contact our office. Our fax  number is [phone].  If you have an urgent issue when the clinic is closed that cannot wait until the next business day, you can page your doctor at the number below.    Please note that while we do our best to be available for urgent issues outside of office hours, we are not available 24/7.   If you have an urgent issue and are unable to reach us , you may choose to seek medical care at your doctor's office, retail clinic, urgent care center, or emergency room.  If you have a medical emergency, please immediately call 911 or go to the emergency department.  Pager Numbers  - Dr. Hester: [phone]  - Dr. Jackquline: [phone]  - Dr. Claudene: [phone]   - Dr. Raymund: [phone]  In the event of inclement weather, please call our main line at [phone] for an update on the status of any delays or closures.  Dermatology Medication Tips: Please keep the boxes that topical medications come in in order to help keep track of the instructions about where and how to use these. Pharmacies typically print the medication instructions only on the boxes and not directly on the medication tubes.   If your medication is too expensive, please contact our office at [phone] option 4 or send us  a message through MyChart.   We are unable to tell what your co-pay for medications will be in advance as this is different depending on your insurance coverage. However, we may be able to find a substitute medication at lower cost or fill out paperwork to get insurance to cover a needed  medication.   If a prior authorization is required to get your medication covered by your insurance company, please allow us  1-2 business days to complete this process.  Drug prices often vary depending on where the prescription is filled and some pharmacies may offer cheaper prices.  The website www.goodrx.com contains coupons for medications through different pharmacies. The prices here do not account for  what the cost may be with help from insurance (it may be cheaper with your insurance), but the website can give you the price if you did not use any insurance.  - You can print the associated coupon and take it with your prescription to the pharmacy.  - You may also stop by our office during regular business hours and pick up a GoodRx coupon card.  - If you need your prescription sent electronically to a different pharmacy, notify our office through [facility] or by phone at [phone] option 4.     Si Usted Necesita Algo Despus de Su Visita  Tambin puede enviarnos un mensaje a travs de Clinical cytogeneticist. Por lo general respondemos a los mensajes de MyChart en el transcurso de 1 a 2 das hbiles.  Para renovar recetas, por favor pida a su farmacia que se ponga en contacto con nuestra oficina. Randi lakes de fax es Ramos [phone].  Si tiene un asunto urgente cuando la clnica est cerrada y que no puede esperar hasta el siguiente da hbil, puede llamar/localizar a su doctor(a) al nmero que aparece a continuacin.   Por favor, tenga en cuenta que aunque hacemos todo lo posible para estar disponibles para asuntos urgentes fuera del horario de [place], no estamos disponibles las 24 horas del da, los 7 [address]  [address] de la [place].   Si tiene un problema urgente y no puede comunicarse con nosotros, puede optar por buscar atencin mdica  en el consultorio de su doctor(a), en una clnica privada, en un centro de atencin urgente o en una sala de emergencias.  Si tiene Engineer, drilling, por favor llame inmediatamente al 911 o vaya a la sala de emergencias.  Nmeros de bper  - Dr. Hester: [phone]  - Dra. Jackquline: [phone]  - Dr. Claudene: [phone]  - Dra. Kitts: [phone]  En caso de inclemencias del [place], por favor llame a nuestra lnea principal al [phone] para una actualizacin sobre el estado de cualquier retraso o cierre.  Consejos para la medicacin en  dermatologa: Por favor, guarde las cajas en las que vienen los medicamentos de uso tpico para ayudarle a seguir las instrucciones sobre dnde y cmo usarlos. Las farmacias generalmente imprimen las instrucciones del medicamento slo en las cajas y no directamente en los tubos del [place].   Si su medicamento es muy caro, por favor, pngase en contacto con landry rieger llamando al [phone] y presione la opcin 4 o envenos un mensaje a travs de Clinical cytogeneticist.   No podemos decirle cul ser su copago por los medicamentos por adelantado ya que esto es diferente dependiendo de la cobertura de su seguro. Sin embargo, es posible que podamos encontrar un medicamento sustituto a Audiological scientist un formulario para que el seguro cubra el medicamento que se considera necesario.   Si se requiere una autorizacin previa para que su compaa de seguros malta su medicamento, por favor permtanos de 1 a 2 das hbiles para completar este proceso.  Los precios de los medicamentos varan con frecuencia dependiendo del Environmental consultant de dnde se surte la receta y Careers adviser pueden ofrecer  precios ms baratos.  El sitio web www.goodrx.com tiene cupones para medicamentos de Health and safety inspector. Los precios aqu no tienen en cuenta lo que podra costar con la ayuda del seguro (puede ser ms barato con su seguro), pero el sitio web puede darle el precio si no utiliz Tourist information centre manager.  - Puede imprimir el cupn correspondiente y llevarlo con su receta a la farmacia.  - Tambin puede pasar por nuestra oficina durante el horario de atencin regular y Education officer, museum una tarjeta de cupones de GoodRx.  - Si necesita que su receta se enve electrnicamente a una farmacia diferente, informe a nuestra oficina a travs de MyChart de [place] o por telfono llamando al [phone] y presione la opcin 4.

## 2024-04-12 NOTE — Progress Notes (Signed)
 Follow-Up Visit   Subjective  Jeffrey Mcmahon is a 73 y.o. male who presents for the following: Psoriasis of the  trunk and extremities, clear per patient. He has had 2 Skyrizi  injections and is much improved, loading dose 01/17/24. He has clobetasol cream at home, but not using. He stopped methotrexate when he started Skyrizi . No side effects.  Follow-up history of BCC of the left upper lip/inferior nostril EDC 02/15/2024.  This patient is accompanied in the office by his wife.   The following portions of the chart were reviewed this encounter and updated as appropriate: medications, allergies, medical history  Review of Systems:  No other skin or systemic complaints except as noted in HPI or Assessment and Plan.  Objective  Well appearing patient in no apparent distress; mood and affect are within normal limits.  Areas Examined: Face, arms  Relevant exam findings are noted in the Assessment and Plan.      Assessment & Plan  ACTINIC DAMAGE - chronic, secondary to cumulative UV radiation exposure/sun exposure over time - diffuse scaly erythematous macules with underlying dyspigmentation - Recommend daily broad spectrum sunscreen SPF 30+ to sun-exposed areas, reapply every 2 hours as needed.  - Recommend staying in the shade or wearing long sleeves, sun glasses (UVA+UVB protection) and wide brim hats (4-inch brim around the entire circumference of the hat). - Call for new or changing lesions.  HISTORY OF BASAL CELL CARCINOMA OF THE SKIN Left upper cutaneous lip/inferior nostril, EDC 02/15/24 - No evidence of recurrence today - Recommend regular full body skin exams - Recommend daily broad spectrum sunscreen SPF 30+ to sun-exposed areas, reapply every 2 hours as needed.  - Call if any new or changing lesions are noted between office visits   PSORIASIS on systemic treatment Exam: Mild erythema and scale on the knees. 2% BSA. Rest of body clear  Chronic and persistent condition  with duration or expected duration over one year. Condition is improving with treatment but not currently at goal.   Counseling and coordination of care for severe psoriasis on systemic treatment  Psoriasis - severe on systemic treatment.  Psoriasis is a chronic non-curable, but treatable genetic/hereditary disease that may have other systemic features affecting other organ systems such as joints (Psoriatic Arthritis).  It is linked with heart disease, inflammatory bowel disease, non-alcoholic fatty liver disease, and depression. Significant skin psoriasis and/or psoriatic arthritis may have significant symptoms and affects activities of daily activity and often benefits from systemic treatments.  These systemic treatments have some potential side effects including immunosuppression and require pre-treatment laboratory screening and periodic laboratory monitoring and periodic in person evaluation and monitoring by the attending dermatologist physician (long term medication management).   Patient denies joint pain  Treatment Plan: Labs 11/30/2023  Continue Clobetasol cream once to twice daily to aas for flares, Avoid applying to face, groin, and axilla. Use as directed. Long-term use can cause thinning of the skin.  Continue Skyrizi  injections q 12 wks for maintenance dose. He has nurse appt 05/08/24 for next injection.   Reviewed risks of biologics including immunosuppression, infections (i.e. TB reactivation), injection site reaction, and failure to improve condition. Goal is control of skin condition, not cure.  Some older biologics such as Humira and Enbrel may slightly increase risk of malignancy and may worsen congestive heart failure.  Taltz, Cosentyx, and Bimzelx may cause inflammatory bowel disease to flare or may increase incidence of yeast infections. Skyrizi , Tremfya , and Stelara may also slightly increase risk  of infection. The use of biologics requires long term medication management,  including periodic office visits, annual TB screening test and monitoring of blood work.   Long term medication management.  Patient is using long term (months to years) prescription medication  to control their dermatologic condition.  These medications require periodic monitoring to evaluate for efficacy and side effects and may require periodic laboratory monitoring.   Return in about 6 months (around 10/10/2024) for Psoriasis and UBSE. As scheduled with nurse for Skyrizi  injection.  IAndrea Mcmahon, CMA, am acting as scribe for Rexene Rattler, MD .   Documentation: I have reviewed the above documentation for accuracy and completeness, and I agree with the above.  Rexene Rattler, MD

## 2024-05-08 ENCOUNTER — Ambulatory Visit

## 2024-05-08 DIAGNOSIS — L409 Psoriasis, unspecified: Secondary | ICD-10-CM

## 2024-05-08 MED ORDER — RISANKIZUMAB-RZAA 150 MG/ML ~~LOC~~ SOAJ
150.0000 mg | Freq: Once | SUBCUTANEOUS | Status: AC
  Administered 2024-05-08: 150 mg via SUBCUTANEOUS

## 2024-05-08 NOTE — Progress Notes (Signed)
 Patient here today for Skyrizi  injection for psoriasis.    Skyrizi  150mg  Pen injected into left upper arm. Patient tolerated injection well.  Lot: [id number] EXP: 05/10/2025     [name], RMA

## 2024-05-24 ENCOUNTER — Encounter (INDEPENDENT_AMBULATORY_CARE_PROVIDER_SITE_OTHER): Payer: Self-pay | Admitting: Gastroenterology

## 2024-07-31 ENCOUNTER — Ambulatory Visit

## 2024-08-01 ENCOUNTER — Ambulatory Visit

## 2024-08-01 DIAGNOSIS — L409 Psoriasis, unspecified: Secondary | ICD-10-CM | POA: Diagnosis not present

## 2024-08-01 MED ORDER — RISANKIZUMAB-RZAA 150 MG/ML ~~LOC~~ SOAJ
150.0000 mg | Freq: Once | SUBCUTANEOUS | Status: AC
  Administered 2024-08-01: 150 mg via SUBCUTANEOUS

## 2024-08-01 NOTE — Progress Notes (Signed)
 Patient here today for Skyrizi  injection for psoriasis.    Skyrizi  150mg  Pen injected into right upper arm. Patient tolerated injection well.  Lot: [id number] EXP: 07/10/2025     [name], RMA

## 2024-10-10 ENCOUNTER — Ambulatory Visit: Admitting: Dermatology
